# Patient Record
Sex: Female | Born: 1975 | Race: Black or African American | Hispanic: No | Marital: Married | Smoking: Never smoker
Health system: Southern US, Community
[De-identification: ages and names within clinical notes are randomized; demographics above are authoritative.]

## PROBLEM LIST (undated history)

## (undated) DIAGNOSIS — D649 Anemia, unspecified: Secondary | ICD-10-CM

## (undated) DIAGNOSIS — D259 Leiomyoma of uterus, unspecified: Secondary | ICD-10-CM

## (undated) HISTORY — PX: TUBAL LIGATION: SHX77

---

## 1998-03-01 ENCOUNTER — Ambulatory Visit (HOSPITAL_COMMUNITY): Admission: RE | Admit: 1998-03-01 | Discharge: 1998-03-01 | Payer: Self-pay | Admitting: Internal Medicine

## 1998-03-07 ENCOUNTER — Other Ambulatory Visit: Admission: RE | Admit: 1998-03-07 | Discharge: 1998-03-07 | Payer: Self-pay | Admitting: Internal Medicine

## 2011-10-19 DIAGNOSIS — E049 Nontoxic goiter, unspecified: Secondary | ICD-10-CM | POA: Insufficient documentation

## 2011-10-19 DIAGNOSIS — D649 Anemia, unspecified: Secondary | ICD-10-CM | POA: Insufficient documentation

## 2011-10-19 DIAGNOSIS — E559 Vitamin D deficiency, unspecified: Secondary | ICD-10-CM | POA: Insufficient documentation

## 2011-10-19 DIAGNOSIS — R87619 Unspecified abnormal cytological findings in specimens from cervix uteri: Secondary | ICD-10-CM | POA: Insufficient documentation

## 2011-10-19 DIAGNOSIS — A6000 Herpesviral infection of urogenital system, unspecified: Secondary | ICD-10-CM | POA: Insufficient documentation

## 2014-04-27 DIAGNOSIS — Z800 Family history of malignant neoplasm of digestive organs: Secondary | ICD-10-CM | POA: Insufficient documentation

## 2014-12-12 DIAGNOSIS — L658 Other specified nonscarring hair loss: Secondary | ICD-10-CM | POA: Insufficient documentation

## 2016-09-22 DIAGNOSIS — E669 Obesity, unspecified: Secondary | ICD-10-CM | POA: Insufficient documentation

## 2018-05-17 ENCOUNTER — Other Ambulatory Visit (HOSPITAL_BASED_OUTPATIENT_CLINIC_OR_DEPARTMENT_OTHER): Payer: Self-pay | Admitting: Nurse Practitioner

## 2018-05-17 ENCOUNTER — Ambulatory Visit (HOSPITAL_BASED_OUTPATIENT_CLINIC_OR_DEPARTMENT_OTHER)
Admission: RE | Admit: 2018-05-17 | Discharge: 2018-05-17 | Disposition: A | Discharge status: Home / Self Care | Payer: Managed Care, Other (non HMO) | Source: Ambulatory Visit | Attending: Nurse Practitioner | Admitting: Nurse Practitioner

## 2018-05-17 DIAGNOSIS — E049 Nontoxic goiter, unspecified: Secondary | ICD-10-CM | POA: Diagnosis present

## 2018-05-17 DIAGNOSIS — M542 Cervicalgia: Secondary | ICD-10-CM | POA: Diagnosis not present

## 2018-05-17 DIAGNOSIS — E042 Nontoxic multinodular goiter: Secondary | ICD-10-CM | POA: Insufficient documentation

## 2018-10-25 ENCOUNTER — Other Ambulatory Visit: Payer: Self-pay | Admitting: Obstetrics and Gynecology

## 2018-10-25 DIAGNOSIS — D250 Submucous leiomyoma of uterus: Secondary | ICD-10-CM

## 2018-11-17 ENCOUNTER — Ambulatory Visit
Admission: RE | Admit: 2018-11-17 | Discharge: 2018-11-17 | Disposition: A | Discharge status: Home / Self Care | Payer: Managed Care, Other (non HMO) | Source: Ambulatory Visit | Attending: Obstetrics and Gynecology | Admitting: Obstetrics and Gynecology

## 2018-11-17 ENCOUNTER — Other Ambulatory Visit: Payer: Self-pay | Admitting: *Deleted

## 2018-11-17 DIAGNOSIS — D250 Submucous leiomyoma of uterus: Secondary | ICD-10-CM

## 2018-11-17 HISTORY — DX: Anemia, unspecified: D64.9

## 2018-11-17 HISTORY — DX: Leiomyoma of uterus, unspecified: D25.9

## 2018-11-17 HISTORY — PX: IR RADIOLOGIST EVAL & MGMT: IMG5224

## 2018-11-17 NOTE — Consult Note (Signed)
Chief Complaint: Patient was seen in consultation today for menorrhagia at the request of [place]  Referring Physician(s): Rivard,Sandra  History of Present Illness: Bailey Bennett is a 42 y.o. female with a long history of menorrhagia and anemia.  Her cycles are regular occurring once every 28 days.  Day 2 and 3 are very heavy with passage of large clots.  She uses super absorbant pads which she has to change hourly.  This is challenging and affects her ability to perform her activities of daily living.   She recently underwent an ultrasound and was diagnosed with a large uterine fibroid.  She currently takes iron supplementation for her anemia.  She denies significant dysmenorrhea.  She endorses bulk related symptoms, particularly dysuria dn urgency early in the morning.   She has not undergone any other hormonal or surgical therapies for her symptoms.  She denies chest pain, shortness of breath, fever, chills, vaginal discharge or other systemic symptoms.   Past Medical History:  Diagnosis Date  . Anemia    secondary to uterine fibroid  . Uterine fibroid     Past Surgical History:  Procedure Laterality Date  . IR RADIOLOGIST EVAL & MGMT  11/17/2018  . TUBAL LIGATION      Allergies: Patient has no known allergies.  Medications: Prior to Admission medications   Medication Sig Start Date End Date Taking? Authorizing Provider  Cholecalciferol (VITAMIN D) 50 MCG (2000 UT) tablet Take by mouth.   Yes [provider]  ferrous sulfate 324 (65 Fe) MG TBEC Take by mouth.   Yes [provider]  Multiple Vitamin (MULTI-VITAMINS) TABS Take by mouth.   Yes [provider]  valACYclovir (VALTREX) 500 MG tablet Take 2 tablets twice a day for 3 days when flares occur 09/22/16  Yes [provider]     No family history on file.  Social History   Socioeconomic History  . Marital status: Married    Spouse name: Not on file  . Number of  children: Not on file  . Years of education: Not on file  . Highest education level: Not on file  Occupational History  . Not on file  Social Needs  . Financial resource strain: Not on file  . Food insecurity:    Worry: Not on file    Inability: Not on file  . Transportation needs:    Medical: Not on file    Non-medical: Not on file  Tobacco Use  . Smoking status: Not on file  Substance and Sexual Activity  . Alcohol use: Not on file  . Drug use: Not on file  . Sexual activity: Not on file  Lifestyle  . Physical activity:    Days per week: Not on file    Minutes per session: Not on file  . Stress: Not on file  Relationships  . Social connections:    Talks on phone: Not on file    Gets together: Not on file    Attends religious service: Not on file    Active member of club or organization: Not on file    Attends meetings of clubs or organizations: Not on file    Relationship status: Not on file  Other Topics Concern  . Not on file  Social History Narrative  . Not on file    Review of Systems: A 12 point ROS discussed and pertinent positives are indicated in the HPI above.  All other systems are negative.  Review of Systems  Vital Signs: BP (!) 127/93 (BP Location: Right Arm, Patient Position: Sitting, Cuff Size: Normal)   Pulse 84   Temp 98.5 F (36.9 C)   Resp 14   LMP 10/28/2018 (Exact Date)   SpO2 100%   Physical Exam  Constitutional: She is oriented to person, place, and time. She appears well-developed and well-nourished. No distress.  HENT:  Head: Normocephalic and atraumatic.  Eyes: No scleral icterus.  Neck: Thyromegaly present.  Cardiovascular: Normal rate and regular rhythm.  Pulmonary/Chest: Effort normal and breath sounds normal.  Abdominal: Soft. She exhibits no distension. There is no tenderness.  Neurological: She is alert and oriented to person, place, and time.  Skin: Skin is warm and dry.  Psychiatric: She has a normal mood and affect.  Her behavior is normal.  Nursing note and vitals reviewed.    Imaging: Ir Radiologist Eval & Mgmt  Result Date: 11/17/2018 Please refer to notes tab for details about interventional procedure. (Op Note)   Labs:  CBC: No results for input(s): WBC, HGB, HCT, PLT in the last 8760 hours.  COAGS: No results for input(s): INR, APTT in the last 8760 hours.  BMP: No results for input(s): NA, K, CL, CO2, GLUCOSE, BUN, CALCIUM, CREATININE, GFRNONAA, GFRAA in the last 8760 hours.  Invalid input(s): CMP  LIVER FUNCTION TESTS: No results for input(s): BILITOT, AST, ALT, ALKPHOS, PROT, ALBUMIN in the last 8760 hours.  TUMOR MARKERS: No results for input(s): AFPTM, CEA, CA199, CHROMGRNA in the last 8760 hours.  Assessment and Plan:  42 year-old female with symptomatic uterine fibroids.  Her primary symptoms are menorrhagia with iron deficiency anemia and bulk relates symptoms including dysuria.    We discussed the natural history of uterine fibroids and the various treatment options including hysterectomy and uterine artery embolization.  We discussed the risks, benefits and alternatives to [place].  AT this time, she wants to avoid surgery and is interested in pursuing [place].   1.) Pelvic MRI with gadolinium contrast.  2.) Barring any unexpected findings on the MRI, proceed with superior hypogastric nerve block and [place].   Thank you for this interesting consult.  I greatly enjoyed meeting Bailey Bennett and look forward to participating in their care.  A copy of this report was sent to the requesting provider on this date.  Electronically Signed: Jacqulynn Cadet 11/17/2018, 4:25 PM   I spent a total of  40 Minutes  in face to face in clinical consultation, greater than 50% of which was counseling/coordinating care for symptomatic uterine fibroids.

## 2019-01-02 ENCOUNTER — Other Ambulatory Visit (HOSPITAL_COMMUNITY): Payer: Self-pay | Admitting: Interventional Radiology

## 2019-01-02 DIAGNOSIS — D259 Leiomyoma of uterus, unspecified: Secondary | ICD-10-CM

## 2019-02-27 DIAGNOSIS — Z01818 Encounter for other preprocedural examination: Secondary | ICD-10-CM | POA: Diagnosis not present

## 2019-02-27 DIAGNOSIS — D252 Subserosal leiomyoma of uterus: Secondary | ICD-10-CM | POA: Diagnosis not present

## 2019-02-27 DIAGNOSIS — Z3202 Encounter for pregnancy test, result negative: Secondary | ICD-10-CM | POA: Diagnosis not present

## 2019-02-27 DIAGNOSIS — N920 Excessive and frequent menstruation with regular cycle: Secondary | ICD-10-CM | POA: Diagnosis not present

## 2019-05-04 DIAGNOSIS — M545 Low back pain: Secondary | ICD-10-CM | POA: Diagnosis not present

## 2019-05-04 DIAGNOSIS — M5417 Radiculopathy, lumbosacral region: Secondary | ICD-10-CM | POA: Diagnosis not present

## 2019-05-04 DIAGNOSIS — M9903 Segmental and somatic dysfunction of lumbar region: Secondary | ICD-10-CM | POA: Diagnosis not present

## 2019-05-04 DIAGNOSIS — M9902 Segmental and somatic dysfunction of thoracic region: Secondary | ICD-10-CM | POA: Diagnosis not present

## 2019-05-10 DIAGNOSIS — M5417 Radiculopathy, lumbosacral region: Secondary | ICD-10-CM | POA: Diagnosis not present

## 2019-05-10 DIAGNOSIS — M545 Low back pain: Secondary | ICD-10-CM | POA: Diagnosis not present

## 2019-05-10 DIAGNOSIS — M9902 Segmental and somatic dysfunction of thoracic region: Secondary | ICD-10-CM | POA: Diagnosis not present

## 2019-05-10 DIAGNOSIS — M9903 Segmental and somatic dysfunction of lumbar region: Secondary | ICD-10-CM | POA: Diagnosis not present

## 2019-05-17 DIAGNOSIS — M545 Low back pain: Secondary | ICD-10-CM | POA: Diagnosis not present

## 2019-05-17 DIAGNOSIS — M5417 Radiculopathy, lumbosacral region: Secondary | ICD-10-CM | POA: Diagnosis not present

## 2019-05-17 DIAGNOSIS — M9902 Segmental and somatic dysfunction of thoracic region: Secondary | ICD-10-CM | POA: Diagnosis not present

## 2019-05-17 DIAGNOSIS — M9903 Segmental and somatic dysfunction of lumbar region: Secondary | ICD-10-CM | POA: Diagnosis not present

## 2019-05-19 DIAGNOSIS — M5417 Radiculopathy, lumbosacral region: Secondary | ICD-10-CM | POA: Diagnosis not present

## 2019-05-19 DIAGNOSIS — M9902 Segmental and somatic dysfunction of thoracic region: Secondary | ICD-10-CM | POA: Diagnosis not present

## 2019-05-19 DIAGNOSIS — M545 Low back pain: Secondary | ICD-10-CM | POA: Diagnosis not present

## 2019-05-19 DIAGNOSIS — M9903 Segmental and somatic dysfunction of lumbar region: Secondary | ICD-10-CM | POA: Diagnosis not present

## 2019-05-25 DIAGNOSIS — M9903 Segmental and somatic dysfunction of lumbar region: Secondary | ICD-10-CM | POA: Diagnosis not present

## 2019-05-25 DIAGNOSIS — M5417 Radiculopathy, lumbosacral region: Secondary | ICD-10-CM | POA: Diagnosis not present

## 2019-05-25 DIAGNOSIS — M545 Low back pain: Secondary | ICD-10-CM | POA: Diagnosis not present

## 2019-05-25 DIAGNOSIS — M9902 Segmental and somatic dysfunction of thoracic region: Secondary | ICD-10-CM | POA: Diagnosis not present

## 2019-08-24 DIAGNOSIS — D259 Leiomyoma of uterus, unspecified: Secondary | ICD-10-CM | POA: Diagnosis not present

## 2019-09-12 DIAGNOSIS — D649 Anemia, unspecified: Secondary | ICD-10-CM | POA: Diagnosis not present

## 2019-09-12 DIAGNOSIS — Z6836 Body mass index (BMI) 36.0-36.9, adult: Secondary | ICD-10-CM | POA: Diagnosis not present

## 2019-09-12 DIAGNOSIS — D259 Leiomyoma of uterus, unspecified: Secondary | ICD-10-CM | POA: Diagnosis not present

## 2019-09-12 DIAGNOSIS — N920 Excessive and frequent menstruation with regular cycle: Secondary | ICD-10-CM | POA: Diagnosis not present

## 2019-09-26 DIAGNOSIS — N920 Excessive and frequent menstruation with regular cycle: Secondary | ICD-10-CM | POA: Diagnosis not present

## 2019-09-26 DIAGNOSIS — D259 Leiomyoma of uterus, unspecified: Secondary | ICD-10-CM | POA: Diagnosis not present

## 2019-11-14 ENCOUNTER — Other Ambulatory Visit: Payer: Self-pay | Admitting: Obstetrics

## 2019-11-29 ENCOUNTER — Other Ambulatory Visit: Payer: Self-pay | Admitting: Obstetrics

## 2019-11-29 ENCOUNTER — Other Ambulatory Visit (HOSPITAL_COMMUNITY)
Admission: RE | Admit: 2019-11-29 | Discharge: 2019-11-29 | Disposition: A | Discharge status: Home / Self Care | Payer: BC Managed Care – PPO | Source: Ambulatory Visit | Attending: Obstetrics | Admitting: Obstetrics

## 2019-11-29 ENCOUNTER — Other Ambulatory Visit (HOSPITAL_COMMUNITY): Payer: Managed Care, Other (non HMO)

## 2019-11-29 DIAGNOSIS — Z01812 Encounter for preprocedural laboratory examination: Secondary | ICD-10-CM | POA: Diagnosis not present

## 2019-11-29 DIAGNOSIS — Z20828 Contact with and (suspected) exposure to other viral communicable diseases: Secondary | ICD-10-CM | POA: Diagnosis not present

## 2019-11-29 LAB — SARS CORONAVIRUS 2 (TAT 6-24 HRS): SARS Coronavirus 2: NEGATIVE

## 2019-11-29 NOTE — Progress Notes (Signed)
[facility] [id number] - [place], [place] - [address] AT [address] [place]  [address] Phone: [phone] Fax: [phone]    Your procedure is scheduled on Friday, December 18th.  Report to [facility] Main Entrance "A" at 6:00 A.M., and check in at the Admitting office.  Call this number if you have problems the morning of surgery:  [phone]  Call [phone] if you have any questions prior to your surgery date Monday-Friday 8am-4pm   Remember:  Do not eat or drink after midnight the night before your surgery  You may drink clear liquids until 5:00 A.M. the morning of your surgery.   Clear liquids allowed are: Water, Non-Citrus Juices (without pulp), Carbonated Beverages, Clear Tea, Black Coffee Only, and Gatorade    Take these medicines the morning of surgery with A SIP OF WATER: NONE  As of today, STOP taking any Aspirin (unless otherwise instructed by your surgeon), Aleve, Naproxen, Ibuprofen, Motrin, Advil, Goody's, BC's, all herbal medications, fish oil, and all vitamins.  The Morning of Surgery  Do not wear jewelry, make-up or nail polish.  Do not wear lotions, powders, perfumes or deodorant  Do not shave 48 hours prior to surgery.    Do not bring valuables to the hospital.  [facility] is not responsible for any belongings or valuables.  If you are a smoker, DO NOT Smoke 24 hours prior to surgery  If you wear a CPAP at night please bring your mask, tubing, and machine the morning of surgery   Remember that you must have someone to transport you home after your surgery, and remain with you for 24 hours if you are discharged the same day.  Please bring cases for contacts, glasses, hearing aids, dentures or bridgework because it cannot be worn into surgery.   Leave your suitcase in the car.  After surgery it may be brought to your room.  For patients admitted to the hospital, discharge time will be determined by  your treatment team.  Patients discharged the day of surgery will not be allowed to drive home.   Special instructions:   - Preparing For Surgery  Before surgery, you can play an important role. Because skin is not sterile, your skin needs to be as free of germs as possible. You can reduce the number of germs on your skin by washing with CHG (chlorahexidine gluconate) Soap before surgery.  CHG is an antiseptic cleaner which kills germs and bonds with the skin to continue killing germs even after washing.    Oral Hygiene is also important to reduce your risk of infection.  Remember - BRUSH YOUR TEETH THE MORNING OF SURGERY WITH YOUR REGULAR TOOTHPASTE  Please do not use if you have an allergy to CHG or antibacterial soaps. If your skin becomes reddened/irritated stop using the CHG.  Do not shave (including legs and underarms) for at least 48 hours prior to first CHG shower. It is OK to shave your face.  Please follow these instructions carefully.   1. Shower the NIGHT BEFORE SURGERY and the MORNING OF SURGERY with CHG Soap.   2. If you chose to wash your hair, wash your hair first as usual with your normal shampoo.  3. After you shampoo, rinse your hair and body thoroughly to remove the shampoo.  4. Use CHG as you would any other liquid soap. You can apply CHG directly to the skin and wash gently  with a scrungie or a clean washcloth.   5. Apply the CHG Soap to your body ONLY FROM THE NECK DOWN.  Do not use on open wounds or open sores. Avoid contact with your eyes, ears, mouth and genitals (private parts). Wash Face and genitals (private parts)  with your normal soap.   6. Wash thoroughly, paying special attention to the area where your surgery will be performed.  7. Thoroughly rinse your body with warm water from the neck down.  8. DO NOT shower/wash with your normal soap after using and rinsing off the CHG Soap.  9. Pat yourself dry with a CLEAN TOWEL.  10. Wear CLEAN  PAJAMAS to bed the night before surgery, wear comfortable clothes the morning of surgery  11. Place CLEAN SHEETS on your bed the night of your first shower and DO NOT SLEEP WITH PETS.  Day of Surgery: Please shower the morning of surgery with the CHG soap Do not apply any deodorants/lotions. Please wear clean clothes to the hospital/surgery center.   Remember to brush your teeth WITH YOUR REGULAR TOOTHPASTE.  Please read over the following fact sheets that you were given.

## 2019-11-30 ENCOUNTER — Encounter (HOSPITAL_COMMUNITY): Payer: Self-pay

## 2019-11-30 ENCOUNTER — Other Ambulatory Visit: Payer: Self-pay

## 2019-11-30 ENCOUNTER — Encounter (HOSPITAL_COMMUNITY)
Admission: RE | Admit: 2019-11-30 | Discharge: 2019-11-30 | Disposition: A | Discharge status: Home / Self Care | Payer: BC Managed Care – PPO | Source: Ambulatory Visit | Attending: Obstetrics | Admitting: Obstetrics

## 2019-11-30 DIAGNOSIS — N888 Other specified noninflammatory disorders of cervix uteri: Secondary | ICD-10-CM | POA: Diagnosis not present

## 2019-11-30 DIAGNOSIS — E559 Vitamin D deficiency, unspecified: Secondary | ICD-10-CM | POA: Diagnosis not present

## 2019-11-30 DIAGNOSIS — D251 Intramural leiomyoma of uterus: Secondary | ICD-10-CM | POA: Diagnosis not present

## 2019-11-30 DIAGNOSIS — D259 Leiomyoma of uterus, unspecified: Secondary | ICD-10-CM | POA: Diagnosis not present

## 2019-11-30 DIAGNOSIS — N920 Excessive and frequent menstruation with regular cycle: Secondary | ICD-10-CM | POA: Diagnosis not present

## 2019-11-30 DIAGNOSIS — R030 Elevated blood-pressure reading, without diagnosis of hypertension: Secondary | ICD-10-CM | POA: Diagnosis not present

## 2019-11-30 DIAGNOSIS — N838 Other noninflammatory disorders of ovary, fallopian tube and broad ligament: Secondary | ICD-10-CM | POA: Diagnosis not present

## 2019-11-30 DIAGNOSIS — D649 Anemia, unspecified: Secondary | ICD-10-CM | POA: Diagnosis not present

## 2019-11-30 DIAGNOSIS — Z20828 Contact with and (suspected) exposure to other viral communicable diseases: Secondary | ICD-10-CM | POA: Diagnosis not present

## 2019-11-30 LAB — CBC
HCT: 39.8 % (ref 36.0–46.0)
Hemoglobin: 12 g/dL (ref 12.0–15.0)
MCH: 25.5 pg — ABNORMAL LOW (ref 26.0–34.0)
MCHC: 30.2 g/dL (ref 30.0–36.0)
MCV: 84.7 fL (ref 80.0–100.0)
Platelets: 355 10*3/uL (ref 150–400)
RBC: 4.7 MIL/uL (ref 3.87–5.11)
RDW: 16.6 % — ABNORMAL HIGH (ref 11.5–15.5)
WBC: 4.7 10*3/uL (ref 4.0–10.5)
nRBC: 0 % (ref 0.0–0.2)

## 2019-11-30 LAB — TYPE AND SCREEN
ABO/RH(D): B POS
Antibody Screen: NEGATIVE

## 2019-11-30 LAB — ABO/RH: ABO/RH(D): B POS

## 2019-11-30 MED ORDER — DEXTROSE 5 % IV SOLN
3.0000 g | INTRAVENOUS | Status: DC
  Filled 2019-11-30: qty 3000

## 2019-11-30 NOTE — Anesthesia Preprocedure Evaluation (Addendum)
Anesthesia Evaluation  Patient identified by MRN, date of birth, ID band Patient awake    Reviewed: Allergy & Precautions, NPO status , Patient's Chart, lab work & pertinent test results  History of Anesthesia Complications Negative for: history of anesthetic complications  Airway Mallampati: II  TM Distance: >3 FB Neck ROM: Full    Dental  (+) Dental Advisory Given, Teeth Intact   Pulmonary neg pulmonary ROS,    Pulmonary exam normal        Cardiovascular negative cardio ROS Normal cardiovascular exam     Neuro/Psych negative neurological ROS  negative psych ROS   GI/Hepatic negative GI ROS, Neg liver ROS,   Endo/Other   Obesity   Renal/GU negative Renal ROS     Musculoskeletal negative musculoskeletal ROS (+)   Abdominal   Peds  Hematology  (+) anemia ,   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  Reproductive/Obstetrics  Fibroids S/p tubal ligation                             Anesthesia Physical Anesthesia Plan  ASA: II  Anesthesia Plan: General   Post-op Pain Management:    Induction: Intravenous  PONV Risk Score and Plan: 4 or greater and Treatment may vary due to age or medical condition, Ondansetron, Scopolamine patch - Pre-op, Dexamethasone and Midazolam  Airway Management Planned: Oral ETT  Additional Equipment: None  Intra-op Plan:   Post-operative Plan: Extubation in OR  Informed Consent: I have reviewed the patients History and Physical, chart, labs and discussed the procedure including the risks, benefits and alternatives for the proposed anesthesia with the patient or authorized representative who has indicated his/her understanding and acceptance.     Dental advisory given  Plan Discussed with: CRNA and Anesthesiologist  Anesthesia Plan Comments:        Anesthesia Quick Evaluation

## 2019-11-30 NOTE — Progress Notes (Addendum)
Your procedure is scheduled on Friday, December 18th, from 08:00 AM to 10:24 AM.  Report to [facility] Main Entrance "A" at 6:00 A.M., and check in at the Admitting office.  Call this number if you have problems the morning of surgery:  [phone]   Remember:  Do not eat or drink after midnight the night before your surgery.   Take these medicines the morning of surgery with A SIP OF WATER: NONE  As of today, STOP taking any Aspirin (unless otherwise instructed by your surgeon), Aleve, Naproxen, Ibuprofen, Motrin, Advil, Goody's, BC's, all herbal medications, fish oil, and all vitamins.  The Morning of Surgery  Do not wear jewelry, make-up or nail polish.  Do not wear lotions, powders, perfumes or deodorant  Do not shave 48 hours prior to surgery.    Do not bring valuables to the hospital.  [facility] is not responsible for any belongings or valuables.  If you are a smoker, DO NOT Smoke 24 hours prior to surgery  If you wear a CPAP at night please bring your mask, tubing, and machine the morning of surgery   Remember that you must have someone to transport you home after your surgery, and remain with you for 24 hours if you are discharged the same day.  Please bring cases for contacts, glasses, hearing aids, dentures or bridgework because it cannot be worn into surgery.   Leave your suitcase in the car.  After surgery it may be brought to your room.  For patients admitted to the hospital, discharge time will be determined by your treatment team.  Patients discharged the day of surgery will not be allowed to drive home.   Special instructions:   [place]- Preparing For Surgery  Before surgery, you can play an important role. Because skin is not sterile, your skin needs to be as free of germs as possible. You can reduce the number of germs on your skin by washing with CHG (chlorahexidine gluconate) Soap before surgery.  CHG is an antiseptic cleaner which kills germs and  bonds with the skin to continue killing germs even after washing.    Please do not use if you have an allergy to CHG or antibacterial soaps. If your skin becomes reddened/irritated stop using the CHG.  Do not shave (including legs and underarms) for at least 48 hours prior to first CHG shower. It is OK to shave your face.  Please follow these instructions carefully.   1. Shower the NIGHT BEFORE SURGERY and the MORNING OF SURGERY with CHG Soap.   2. If you chose to wash your hair, wash your hair first as usual with your normal shampoo.  3. After you shampoo, rinse your hair and body thoroughly to remove the shampoo.  4. Use CHG as you would any other liquid soap. You can apply CHG directly to the skin and wash gently with a scrungie or a clean washcloth.   5. Apply the CHG Soap to your body ONLY FROM THE NECK DOWN.  Do not use on open wounds or open sores. Avoid contact with your eyes, ears, mouth and genitals (private parts). Wash Face and genitals (private parts)  with your normal soap.   6. Wash thoroughly, paying special attention to the area where your surgery will be performed.  7. Thoroughly rinse your body with warm water from the neck down.  8. DO NOT shower/wash with your normal soap after using and rinsing off the CHG Soap.  9. [place]  yourself dry with a CLEAN TOWEL.  10. Wear CLEAN PAJAMAS to bed the night before surgery, wear comfortable clothes the morning of surgery  11. Place CLEAN SHEETS on your bed the night of your first shower and DO NOT SLEEP WITH PETS.  Day of Surgery:  Remember to brush your teeth WITH YOUR REGULAR TOOTHPASTE. Please shower the morning of surgery with the CHG soap Do not apply any deodorants/lotions. Please wear clean clothes to the hospital/surgery center.     Please read over the following fact sheets that you were given.

## 2019-11-30 NOTE — Progress Notes (Signed)
PCP - Eldridge Abrahams, PA Cardiologist - Denies  PPM/ICD - Denies  Chest x-ray - N/A EKG - N/A Stress Test - Denies ECHO - Denies Cardiac Cath - Denies  Sleep Study - Denies  Patient denies being a diabetic.  Blood Thinner Instructions: Aspirin Instructions:  PRE-SURGERY Ensure or G2-   COVID TEST- 11/29/2019   Anesthesia review: No  Patient denies shortness of breath, fever, cough and chest pain at PAT appointment  Coronavirus Screening  Have you experienced the following symptoms:  Cough yes/no: No Fever (>100.33F)  yes/no: No Runny nose yes/no: No Sore throat yes/no: No Difficulty breathing/shortness of breath  yes/no: No  Have you or a family member traveled in the last 14 days and where? yes/no: No   If the patient indicates "YES" to the above questions, their PAT will be rescheduled to limit the exposure to others and, the surgeon will be notified. THE PATIENT WILL NEED TO BE ASYMPTOMATIC FOR 14 DAYS.   If the patient is not experiencing any of these symptoms, the PAT nurse will instruct them to NOT bring anyone with them to their appointment since they may have these symptoms or traveled as well.   Please remind your patients and families that hospital visitation restrictions are in effect and the importance of the restrictions.     All instructions explained to the patient, with a verbal understanding of the material. Patient agrees to go over the instructions while at home for a better understanding. Patient also instructed to self quarantine after being tested for COVID-19. The opportunity to ask questions was provided.

## 2019-12-01 ENCOUNTER — Inpatient Hospital Stay (HOSPITAL_COMMUNITY)
Admission: RE | Admit: 2019-12-01 | Discharge: 2019-12-02 | DRG: 743 | Disposition: A | Discharge status: Home / Self Care | Payer: BC Managed Care – PPO | Attending: Obstetrics | Admitting: Obstetrics

## 2019-12-01 ENCOUNTER — Other Ambulatory Visit: Payer: Self-pay

## 2019-12-01 ENCOUNTER — Encounter (HOSPITAL_COMMUNITY): Payer: Self-pay | Admitting: Obstetrics

## 2019-12-01 ENCOUNTER — Inpatient Hospital Stay (HOSPITAL_COMMUNITY): Payer: BC Managed Care – PPO | Admitting: Anesthesiology

## 2019-12-01 ENCOUNTER — Encounter (HOSPITAL_COMMUNITY)
Admission: RE | Disposition: A | Discharge status: Home / Self Care | Payer: Self-pay | Source: Home / Self Care | Attending: Obstetrics

## 2019-12-01 DIAGNOSIS — N920 Excessive and frequent menstruation with regular cycle: Secondary | ICD-10-CM | POA: Diagnosis present

## 2019-12-01 DIAGNOSIS — Z20828 Contact with and (suspected) exposure to other viral communicable diseases: Secondary | ICD-10-CM | POA: Diagnosis present

## 2019-12-01 DIAGNOSIS — D219 Benign neoplasm of connective and other soft tissue, unspecified: Secondary | ICD-10-CM | POA: Diagnosis present

## 2019-12-01 DIAGNOSIS — D649 Anemia, unspecified: Secondary | ICD-10-CM | POA: Diagnosis not present

## 2019-12-01 DIAGNOSIS — D259 Leiomyoma of uterus, unspecified: Principal | ICD-10-CM | POA: Diagnosis present

## 2019-12-01 DIAGNOSIS — R030 Elevated blood-pressure reading, without diagnosis of hypertension: Secondary | ICD-10-CM | POA: Diagnosis present

## 2019-12-01 HISTORY — PX: HYSTERECTOMY ABDOMINAL WITH SALPINGECTOMY: SHX6725

## 2019-12-01 HISTORY — PX: ABDOMINAL HYSTERECTOMY: SHX81

## 2019-12-01 LAB — POCT PREGNANCY, URINE: Preg Test, Ur: NEGATIVE

## 2019-12-01 SURGERY — HYSTERECTOMY, TOTAL, ABDOMINAL, WITH SALPINGECTOMY
Anesthesia: General | Laterality: Bilateral

## 2019-12-01 MED ORDER — DEXAMETHASONE SODIUM PHOSPHATE 10 MG/ML IJ SOLN
INTRAMUSCULAR | Status: DC | PRN
  Administered 2019-12-01: 10 mg via INTRAVENOUS

## 2019-12-01 MED ORDER — ONDANSETRON HCL 4 MG/2ML IJ SOLN
INTRAMUSCULAR | Status: AC
  Filled 2019-12-01: qty 2

## 2019-12-01 MED ORDER — LIDOCAINE 2% (20 MG/ML) 5 ML SYRINGE
INTRAMUSCULAR | Status: AC
  Filled 2019-12-01: qty 5

## 2019-12-01 MED ORDER — MENTHOL 3 MG MT LOZG: 1.0000 | LOZENGE | OROMUCOSAL | Status: DC | PRN

## 2019-12-01 MED ORDER — VASOPRESSIN 20 UNIT/ML IV SOLN
INTRAVENOUS | Status: AC
  Filled 2019-12-01: qty 1

## 2019-12-01 MED ORDER — LACTATED RINGERS IV SOLN: INTRAVENOUS | Status: DC

## 2019-12-01 MED ORDER — MIDAZOLAM HCL 5 MG/5ML IJ SOLN
INTRAMUSCULAR | Status: DC | PRN
  Administered 2019-12-01: 2 mg via INTRAVENOUS

## 2019-12-01 MED ORDER — FENTANYL CITRATE (PF) 100 MCG/2ML IJ SOLN
INTRAMUSCULAR | Status: AC
  Filled 2019-12-01: qty 2

## 2019-12-01 MED ORDER — ONDANSETRON HCL 4 MG/2ML IJ SOLN
4.0000 mg | Freq: Four times a day (QID) | INTRAMUSCULAR | Status: DC | PRN
  Administered 2019-12-01: 4 mg via INTRAVENOUS
  Filled 2019-12-01: qty 2

## 2019-12-01 MED ORDER — OXYCODONE HCL 5 MG PO TABS
5.0000 mg | ORAL_TABLET | ORAL | Status: DC | PRN
  Administered 2019-12-01 – 2019-12-02 (×4): 10 mg via ORAL
  Filled 2019-12-01 (×4): qty 2

## 2019-12-01 MED ORDER — KETOROLAC TROMETHAMINE 30 MG/ML IJ SOLN
30.0000 mg | Freq: Four times a day (QID) | INTRAMUSCULAR | Status: DC
  Administered 2019-12-01 – 2019-12-02 (×5): 30 mg via INTRAVENOUS
  Filled 2019-12-01 (×5): qty 1

## 2019-12-01 MED ORDER — SCOPOLAMINE 1 MG/3DAYS TD PT72
MEDICATED_PATCH | TRANSDERMAL | Status: AC
  Filled 2019-12-01: qty 1

## 2019-12-01 MED ORDER — ONDANSETRON HCL 4 MG PO TABS: 4.0000 mg | ORAL_TABLET | Freq: Four times a day (QID) | ORAL | Status: DC | PRN

## 2019-12-01 MED ORDER — FENTANYL CITRATE (PF) 100 MCG/2ML IJ SOLN
INTRAMUSCULAR | Status: DC | PRN
  Administered 2019-12-01: 50 ug via INTRAVENOUS
  Administered 2019-12-01: 100 ug via INTRAVENOUS
  Administered 2019-12-01 (×2): 50 ug via INTRAVENOUS

## 2019-12-01 MED ORDER — MIDAZOLAM HCL 2 MG/2ML IJ SOLN
INTRAMUSCULAR | Status: AC
  Filled 2019-12-01: qty 2

## 2019-12-01 MED ORDER — SIMETHICONE 80 MG PO CHEW: 80.0000 mg | CHEWABLE_TABLET | Freq: Four times a day (QID) | ORAL | Status: DC | PRN

## 2019-12-01 MED ORDER — HYDROMORPHONE HCL 1 MG/ML IJ SOLN: 1.0000 mg | INTRAMUSCULAR | Status: DC | PRN

## 2019-12-01 MED ORDER — FENTANYL CITRATE (PF) 250 MCG/5ML IJ SOLN
INTRAMUSCULAR | Status: AC
  Filled 2019-12-01: qty 5

## 2019-12-01 MED ORDER — SCOPOLAMINE 1 MG/3DAYS TD PT72
MEDICATED_PATCH | TRANSDERMAL | Status: DC | PRN
  Administered 2019-12-01: 1 via TRANSDERMAL

## 2019-12-01 MED ORDER — PROMETHAZINE HCL 25 MG/ML IJ SOLN: 6.2500 mg | INTRAMUSCULAR | Status: DC | PRN

## 2019-12-01 MED ORDER — ROCURONIUM BROMIDE 10 MG/ML (PF) SYRINGE
PREFILLED_SYRINGE | INTRAVENOUS | Status: AC
  Filled 2019-12-01: qty 10

## 2019-12-01 MED ORDER — PHENYLEPHRINE HCL-NACL 10-0.9 MG/250ML-% IV SOLN
INTRAVENOUS | Status: DC | PRN
  Administered 2019-12-01: 20 ug/min via INTRAVENOUS

## 2019-12-01 MED ORDER — KETOROLAC TROMETHAMINE 30 MG/ML IJ SOLN: 30.0000 mg | Freq: Four times a day (QID) | INTRAMUSCULAR | Status: DC

## 2019-12-01 MED ORDER — PANTOPRAZOLE SODIUM 40 MG PO TBEC
40.0000 mg | DELAYED_RELEASE_TABLET | Freq: Every day | ORAL | Status: DC
  Administered 2019-12-01 – 2019-12-02 (×2): 40 mg via ORAL
  Filled 2019-12-01 (×2): qty 1

## 2019-12-01 MED ORDER — PROPOFOL 10 MG/ML IV BOLUS
INTRAVENOUS | Status: DC | PRN
  Administered 2019-12-01: 200 mg via INTRAVENOUS

## 2019-12-01 MED ORDER — PROPOFOL 10 MG/ML IV BOLUS
INTRAVENOUS | Status: AC
  Filled 2019-12-01: qty 20

## 2019-12-01 MED ORDER — OXYCODONE HCL 5 MG PO TABS: 5.0000 mg | ORAL_TABLET | Freq: Once | ORAL | Status: DC | PRN

## 2019-12-01 MED ORDER — BUPIVACAINE HCL (PF) 0.25 % IJ SOLN
INTRAMUSCULAR | Status: DC | PRN
  Administered 2019-12-01: 10 mL

## 2019-12-01 MED ORDER — VASOPRESSIN 20 UNIT/ML IV SOLN
INTRAVENOUS | Status: DC | PRN
  Administered 2019-12-01: 20 [IU]

## 2019-12-01 MED ORDER — FENTANYL CITRATE (PF) 100 MCG/2ML IJ SOLN
25.0000 ug | INTRAMUSCULAR | Status: DC | PRN
  Administered 2019-12-01 (×3): 50 ug via INTRAVENOUS

## 2019-12-01 MED ORDER — ROCURONIUM BROMIDE 10 MG/ML (PF) SYRINGE
PREFILLED_SYRINGE | INTRAVENOUS | Status: DC | PRN
  Administered 2019-12-01: 20 mg via INTRAVENOUS
  Administered 2019-12-01: 10 mg via INTRAVENOUS
  Administered 2019-12-01: 80 mg via INTRAVENOUS

## 2019-12-01 MED ORDER — OXYCODONE HCL 5 MG/5ML PO SOLN: 5.0000 mg | Freq: Once | ORAL | Status: DC | PRN

## 2019-12-01 MED ORDER — ONDANSETRON HCL 4 MG/2ML IJ SOLN
INTRAMUSCULAR | Status: DC | PRN
  Administered 2019-12-01: 4 mg via INTRAVENOUS

## 2019-12-01 MED ORDER — BUPIVACAINE HCL (PF) 0.25 % IJ SOLN
INTRAMUSCULAR | Status: AC
  Filled 2019-12-01: qty 30

## 2019-12-01 MED ORDER — DEXAMETHASONE SODIUM PHOSPHATE 10 MG/ML IJ SOLN
INTRAMUSCULAR | Status: AC
  Filled 2019-12-01: qty 1

## 2019-12-01 MED ORDER — SODIUM CHLORIDE 0.9 % IV SOLN: INTRAVENOUS | Status: DC

## 2019-12-01 MED ORDER — ACETAMINOPHEN 500 MG PO TABS
1000.0000 mg | ORAL_TABLET | Freq: Four times a day (QID) | ORAL | Status: DC
  Administered 2019-12-01 – 2019-12-02 (×4): 1000 mg via ORAL
  Filled 2019-12-01 (×5): qty 2

## 2019-12-01 MED ORDER — LIDOCAINE 2% (20 MG/ML) 5 ML SYRINGE
INTRAMUSCULAR | Status: DC | PRN
  Administered 2019-12-01: 60 mg via INTRAVENOUS

## 2019-12-01 MED ORDER — SUGAMMADEX SODIUM 200 MG/2ML IV SOLN
INTRAVENOUS | Status: DC | PRN
  Administered 2019-12-01: 200 mg via INTRAVENOUS

## 2019-12-01 SURGICAL SUPPLY — 49 items
CANISTER SUCT 3000ML PPV (MISCELLANEOUS) ×3 IMPLANT
COVER WAND RF STERILE (DRAPES) ×3 IMPLANT
DECANTER SPIKE VIAL GLASS SM (MISCELLANEOUS) IMPLANT
DERMABOND ADVANCED (GAUZE/BANDAGES/DRESSINGS) ×2
DERMABOND ADVANCED .7 DNX12 (GAUZE/BANDAGES/DRESSINGS) IMPLANT
DRAPE WARM FLUID 44X44 (DRAPES) ×2 IMPLANT
DRSG OPSITE POSTOP 4X10 (GAUZE/BANDAGES/DRESSINGS) ×3 IMPLANT
DRSG PAD ABDOMINAL 8X10 ST (GAUZE/BANDAGES/DRESSINGS) IMPLANT
DURAPREP 26ML APPLICATOR (WOUND CARE) ×3 IMPLANT
ELECT BLADE 6.5 EXT (BLADE) ×2 IMPLANT
ELECT NDL TIP 2.8 STRL (NEEDLE) IMPLANT
ELECT NEEDLE TIP 2.8 STRL (NEEDLE) IMPLANT
GAUZE 4X4 16PLY RFD (DISPOSABLE) ×2 IMPLANT
GAUZE SPONGE 4X4 12PLY STRL (GAUZE/BANDAGES/DRESSINGS) ×2 IMPLANT
GLOVE BIO SURGEON STRL SZ 6.5 (GLOVE) ×2 IMPLANT
GLOVE BIO SURGEONS STRL SZ 6.5 (GLOVE) ×1
GLOVE BIOGEL PI IND STRL 7.0 (GLOVE) ×3 IMPLANT
GLOVE BIOGEL PI INDICATOR 7.0 (GLOVE) ×8
GOWN STRL REUS W/ TWL LRG LVL3 (GOWN DISPOSABLE) ×3 IMPLANT
GOWN STRL REUS W/TWL LRG LVL3 (GOWN DISPOSABLE) ×6
HIBICLENS CHG 4% 4OZ BTL (MISCELLANEOUS) ×1 IMPLANT
KIT TURNOVER KIT B (KITS) ×3 IMPLANT
NEEDLE HYPO 22GX1.5 SAFETY (NEEDLE) ×5 IMPLANT
NS IRRIG 1000ML POUR BTL (IV SOLUTION) ×3 IMPLANT
PACK ABDOMINAL GYN (CUSTOM PROCEDURE TRAY) ×3 IMPLANT
PAD ARMBOARD 7.5X6 YLW CONV (MISCELLANEOUS) ×3 IMPLANT
PAD OB MATERNITY 4.3X12.25 (PERSONAL CARE ITEMS) ×3 IMPLANT
RETRACTOR WND ALEXIS 25 LRG (MISCELLANEOUS) IMPLANT
RTRCTR WOUND ALEXIS 25CM LRG (MISCELLANEOUS) ×3
SPECIMEN JAR MEDIUM (MISCELLANEOUS) ×3 IMPLANT
SPONGE LAP 18X18 RF (DISPOSABLE) ×4 IMPLANT
STAPLER VISISTAT 35W (STAPLE) ×3 IMPLANT
SUT MON AB 4-0 PS1 27 (SUTURE) ×3 IMPLANT
SUT PDS AB 0 CTX 60 (SUTURE) IMPLANT
SUT PLAIN 2 0 XLH (SUTURE) IMPLANT
SUT VIC AB 0 CT1 18XCR BRD8 (SUTURE) ×2 IMPLANT
SUT VIC AB 0 CT1 27 (SUTURE) ×4
SUT VIC AB 0 CT1 27XBRD ANBCTR (SUTURE) ×2 IMPLANT
SUT VIC AB 0 CT1 8-18 (SUTURE) ×6
SUT VIC AB 2-0 CT1 27 (SUTURE) ×2
SUT VIC AB 2-0 CT1 TAPERPNT 27 (SUTURE) ×1 IMPLANT
SUT VIC AB 2-0 SH 27 (SUTURE) ×2
SUT VIC AB 2-0 SH 27XBRD (SUTURE) IMPLANT
SUT VIC AB 3-0 SH 27 (SUTURE)
SUT VIC AB 3-0 SH 27X BRD (SUTURE) IMPLANT
SUT VICRYL 1 TIES 12X18 (SUTURE) ×3 IMPLANT
SYR CONTROL 10ML LL (SYRINGE) ×3 IMPLANT
TOWEL GREEN STERILE FF (TOWEL DISPOSABLE) ×6 IMPLANT
TRAY FOLEY W/BAG SLVR 14FR (SET/KITS/TRAYS/PACK) ×3 IMPLANT

## 2019-12-01 NOTE — Transfer of Care (Signed)
Immediate Anesthesia Transfer of Care Note  Patient: Bailey Bennett  Procedure(s) Performed: HYSTERECTOMY ABDOMINAL WITH SALPINGECTOMY (Bilateral )  Patient Location: PACU  Anesthesia Type:General  Level of Consciousness: drowsy  Airway & Oxygen Therapy: Patient Spontanous Breathing and Patient connected to nasal cannula oxygen  Post-op Assessment: Report given to RN and Post -op Vital signs reviewed and stable  Post vital signs: Reviewed and stable  Last Vitals:  Vitals Value Taken Time  BP 136/98 12/01/19 1053  Temp 37.1 C 12/01/19 1053  Pulse 81 12/01/19 1057  Resp 17 12/01/19 1057  SpO2 100 % 12/01/19 1057  Vitals shown include unvalidated device data.  Last Pain:  Vitals:   12/01/19 1053  TempSrc:   PainSc: Asleep         Complications: No apparent anesthesia complications

## 2019-12-01 NOTE — H&P (Signed)
Chief complaint: Menorrhagia, fibroids  History of present illness: 43 year old G5 P2 with longstanding history of menorrhagia and known fibroids.  Patient desiring definitive surgical management.  Patient has had some relief of her menorrhagia over the past 2 months since starting on Oriahn.   Patient has been unable to have a Pap smear or endometrial biopsy preoperatively due to uterine displacement from large fibroid and inability to visualize or palpate cervix.  Patient has had a recent ultrasound in October 2020 showing uterus 15 x 11 x 11 cm with a volume of 675 cc.  Multiple fibroids between 4 and 10 cm largest being a posterior subserosal 10 cm fibroid.  Past Medical History:  Diagnosis Date  . Anemia    secondary to uterine fibroid  . Uterine fibroid     Past Surgical History:  Procedure Laterality Date  . IR RADIOLOGIST EVAL & MGMT  11/17/2018  . TUBAL LIGATION      Medications: Iron,Oriahn No known drug allergies  Physical exam: Vitals:   12/01/19 0624  BP: (!) 145/102  Pulse: 90  Resp: 18  Temp: 98.6 F (37 C)  TempSrc: Oral  SpO2: 100%  Weight: 116.6 kg  Height: 5\' 11"  (1.803 m)   General: Well-appearing, no distress Abdomen: Soft nontender, fibroid palpable below the umbilicus Skin: Warm and dry GU: Deferred to the OR Lower extremity: Nontender no edema  CBC    Component Value Date/Time   WBC 4.7 11/30/2019 1355   RBC 4.70 11/30/2019 1355   HGB 12.0 11/30/2019 1355   HCT 39.8 11/30/2019 1355   PLT 355 11/30/2019 1355   MCV 84.7 11/30/2019 1355   MCH 25.5 (L) 11/30/2019 1355   MCHC 30.2 11/30/2019 1355   RDW 16.6 (H) 11/30/2019 1355   Assessment plan: 43 year old G5 P2 nonpregnant patient with mass-effect from large fibroids and menorrhagia for definitive surgical management.  Planning total abdominal hysterectomy with bilateral salpingectomy and ovarian retention. -Risk benefits and alternatives to procedure discussed with patient who agrees to  proceed  Ala Dach 12/01/2019 7:44 AM

## 2019-12-01 NOTE — Op Note (Signed)
12/01/2019  10:43 AM  PATIENT:  Bailey Bennett  43 y.o. female  PRE-OPERATIVE DIAGNOSIS:  Uterine Fibroids, Menorrhagia  POST-OPERATIVE DIAGNOSIS:  Uterine Fibroids, Menorrhagia  PROCEDURE:  Procedure(s): HYSTERECTOMY ABDOMINAL WITH SALPINGECTOMY (Bilateral)  SURGEON:  Surgeon(s) and Role:    * Aloha Gell, MD - Primary    * Azucena Fallen, MD - Assisting  PHYSICIAN ASSISTANT: None  ASSISTANTS: As above  ANESTHESIA:   local and general  EBL:  200 mL   BLOOD ADMINISTERED:none  DRAINS: Urinary Catheter (Foley)   LOCAL MEDICATIONS USED:  MARCAINE     SPECIMEN:  Source of Specimen:  Uterus cervix bilateral fallopian tubes  DISPOSITION OF SPECIMEN:  PATHOLOGY  COUNTS:  YES  TOURNIQUET:  * No tourniquets in log *  DICTATION: .Note written in EPIC  PLAN OF CARE: Admit to inpatient   PATIENT DISPOSITION:  PACU - hemodynamically stable.   Delay start of Pharmacological VTE agent (>24hrs) due to surgical blood loss or risk of bleeding: yes    Bailey Bennett PROCEDURE DATE: 12/01/2019  PREOPERATIVE DIAGNOSIS:   Symptomatic fibroids, menorrhagia POSTOPERATIVE DIAGNOSIS:  Symptomatic fibroids, menorrhagia SURGEON:   Aloha Gell, MD OPERATION:  Total abdominal hysterectomy  with bilateral salpingectomy and ovarian retention ANESTHESIA:  General endotracheal.  INDICATIONS: The patient is a 43 y.o. G5, P2 long history of symptomatic uterine fibroids/menorrhagia. The patient made a decision to undergo definite surgical treatment. On the preoperative visit, the risks, benefits, indications, and alternatives of the procedure were reviewed with the patient.  On the day of surgery, the risks of surgery were again discussed with the patient including but not limited to: bleeding which may require transfusion or reoperation; infection which may require antibiotics; injury to bowel, bladder, ureters or other surrounding organs; need for additional procedures; thromboembolic  phenomenon, incisional problems, long-term risk of pelvic organ prolapse and other postoperative/anesthesia complications. Written informed consent was obtained.    OPERATIVE FINDINGS: A 16 week size fibroid uterus with normal tubes and ovaries bilaterally.  ESTIMATED BLOOD LOSS: 200 ml Antibiotics: 2 g Ancef. SPECIMENS:  Uterus, cervix and bilateral tubes,  sent to pathology COMPLICATIONS:  None immediate.   DESCRIPTION OF PROCEDURE: The patient received intravenous antibiotics, 3 g Ancef, and had sequential compression devices applied to her lower extremities while in the preoperative area.   She was taken to the operating room and placed under general anesthesia without difficulty and found to be adequate.The abdomen and perineum were prepped and draped in a sterile manner, and a Foley catheter was inserted into the bladder and attached to constant drainage. After an adequate timeout was performed, a bimanual exam was done to plan the appropriate surgical incision.  A Pfannensteil skin incision was made. This incision was taken down to the fascia using electrocautery with care given to maintain good hemostasis. The fascia was incised in the midline and the fascial incision was then extended bilaterally using sharp dissection. The fascia was then dissected off the underlying rectus muscles using blunt and sharp dissection with cautery when needed. The rectus muscles were split bluntly in the midline and the peritoneum entered sharply without complication. This peritoneal incision was then extended superiorly and inferiorly with care given to prevent bowel or bladder injury.   Attention was then turned to the pelvis.  Enlarged uterus with multiple fibroids were noted.  An Alexis retractor was placed and the bowel was packed away with moist sponges.  The left round ligament was identified elevated with Babcock clamp and suture  ligated then transected.  Anterior and  posterior dissection was done in the  broad ligament.  The right round ligament was identified elevated with Babcock clamp suture-ligated then transected.  Dissection was done in the broad ligament.  This was carried around anteriorly to dissect down the bladder.  The right utero-ovarian ligament was identified a window was created in the broad ligament and curved Haney x2 was placed through this window.  The right utero-ovarian ligament was transected and free tie then suture ligated.  Hemostasis was noted.   At this point due to the large immobile uterus and given that we needed additional traction to see the bilateral adnexa the Alexis retractor was removed.  Using several tenaculums placed into the uterus and the fibroid, after first infusing with vasopressin, the uterus was delivered out of the pelvis.  At this point much better visualization was noted.  The left adnexal structures were noted.  Left fallopian tube was placed on stretch and the mesosalpinx was dissected toward the utero-ovarian ligament.  The left tube was then transected with cautery.  The left utero-ovarian ligament was doubly clamped after prescribing window in the broad ligament.  This was transected then suture ligated.  Dissection was carried down the broad ligament to skeletonize the last left uterine artery which was doubly clamped and transected.  Prior to this additional dissection of the bladder was done.  The right uterine artery was skeletonized with comminution electrocautery and sharp dissection.  This was then doubly clamped transected and ligated..  Straight Heaney clamps were used to come down the cardinal ligament.  These were clamped dissected with the scalpel and suture ligated.  Once we are at the level of the mid cervix the uterus with fibroids were transected with the scalpel.  Having the uterus off the operative field we could better concentrate on the dissection of the cervix.  Several straight bites with the straight Heaney clamps were used to come  down the cardinal ligament.  These were all clamped transected with the scalpel and suture ligated.  Once at the level of the uterosacral ligaments a curved Haney was used to grasp the uterosacrals.  Similar procedure was carried out on each side.  Uterosacral was clamped transected with the curved scissors and suture ligated these pedicles were used later to incorporate to the vaginal cuff.  Curved clamps were then used to transect at the top of the vagina and just under the cervix.  Suture ligation was done of the cuff angle.  The cervix was transected with curved scissors.  A running locked 0 Vicryl suture was used for the midpoint of the vaginal cuff.  Once hemostasis was assured the uterosacral pedicle was tied to the vaginal angle on the ipsilateral side.  This was done laterally.  Then reassess the right fallopian tube as this was not removed earlier.  Placing a Babcock on the tube electrocautery was used to come under the mesosalpinx.  At the medial junction a Emmitte Surgeon clamp was placed under the fallopian tube this was transected and suture-ligated.  Hemostasis was noted.  Ureters were palpated bilaterally low in the pelvis.  Visualization was limited by the patient's habitus but ureteric snap was felt bilaterally.  The ureters were far from the sidewall dissections.  Vaginal cuff was then reevaluated a small amount of bleeding was noted from the left uterine artery and several additional sutures were used to control hemostasis.  All pedicles were evaluated and found to be hemostatic.  Warm normal saline  was used to irrigate the pelvis.  No additional bleeding was noted.  Chabely Norby clamps were placed on the peritoneum after all suture tags were removed and the packing was removed.  About 20 cc of quarter percent Marcaine was dumped into the pelvis.  2-0 Vicryl was used to close the peritoneum.  Underside of the fascia was reinspected found to be hemostatic the fascia was closed with 0 Vicryl in a running  fashion in 2 halves.  The Scarpa's layer was closed with 2-0 plain and the skin was closed with 4-0 Monocryl in a running subcuticular fashion.  Marland Kitchen Sponge, lap, needle, and instrument counts were correct times two. The patient was taken to the recovery area awake, extubated and in stable condition.  Ala Dach 12/01/2019 11:01 AM

## 2019-12-01 NOTE — Anesthesia Procedure Notes (Signed)
Procedure Name: Intubation Date/Time: 12/01/2019 7:59 AM Performed by: Kyung Rudd, CRNA Pre-anesthesia Checklist: Patient identified, Emergency Drugs available, Suction available and Patient being monitored Patient Re-evaluated:Patient Re-evaluated prior to induction Oxygen Delivery Method: Circle system utilized Preoxygenation: Pre-oxygenation with 100% oxygen Induction Type: IV induction Ventilation: Mask ventilation without difficulty Laryngoscope Size: Mac and 3 Grade View: Grade I Tube type: Oral Tube size: 7.0 mm Number of attempts: 1 Airway Equipment and Method: Stylet Placement Confirmation: ETT inserted through vocal cords under direct vision,  positive ETCO2 and breath sounds checked- equal and bilateral Secured at: 21 cm Tube secured with: Tape Dental Injury: Teeth and Oropharynx as per pre-operative assessment

## 2019-12-01 NOTE — Brief Op Note (Signed)
12/01/2019  10:43 AM  PATIENT:  Bailey Bennett  43 y.o. female  PRE-OPERATIVE DIAGNOSIS:  Uterine Fibroids, Menorrhagia  POST-OPERATIVE DIAGNOSIS:  Uterine Fibroids, Menorrhagia  PROCEDURE:  Procedure(s): HYSTERECTOMY ABDOMINAL WITH SALPINGECTOMY (Bilateral)  SURGEON:  Surgeon(s) and Role:    * Aloha Gell, MD - Primary    * Azucena Fallen, MD - Assisting  PHYSICIAN ASSISTANT: None  ASSISTANTS: As above  ANESTHESIA:   local and general  EBL:  200 mL   BLOOD ADMINISTERED:none  DRAINS: Urinary Catheter (Foley)   LOCAL MEDICATIONS USED:  MARCAINE     SPECIMEN:  Source of Specimen:  Uterus cervix bilateral fallopian tubes  DISPOSITION OF SPECIMEN:  PATHOLOGY  COUNTS:  YES  TOURNIQUET:  * No tourniquets in log *  DICTATION: .Note written in EPIC  PLAN OF CARE: Admit to inpatient   PATIENT DISPOSITION:  PACU - hemodynamically stable.   Delay start of Pharmacological VTE agent (>24hrs) due to surgical blood loss or risk of bleeding: yes

## 2019-12-01 NOTE — Anesthesia Postprocedure Evaluation (Signed)
Anesthesia Post Note  Patient: Bailey Bennett  Procedure(s) Performed: HYSTERECTOMY ABDOMINAL WITH SALPINGECTOMY (Bilateral )     Patient location during evaluation: PACU Anesthesia Type: General Level of consciousness: awake and alert Pain management: pain level controlled Vital Signs Assessment: post-procedure vital signs reviewed and stable Respiratory status: spontaneous breathing, nonlabored ventilation and respiratory function stable Cardiovascular status: blood pressure returned to baseline and stable Postop Assessment: no apparent nausea or vomiting Anesthetic complications: no    Last Vitals:  Vitals:   12/01/19 1223 12/01/19 1328  BP: (!) 134/98 (!) 136/93  Pulse: 89 87  Resp: 14 16  Temp:  37 C  SpO2: 99% 98%                   Audry Pili

## 2019-12-02 LAB — CBC
HCT: 34 % — ABNORMAL LOW (ref 36.0–46.0)
Hemoglobin: 10.5 g/dL — ABNORMAL LOW (ref 12.0–15.0)
MCH: 26.1 pg (ref 26.0–34.0)
MCHC: 30.9 g/dL (ref 30.0–36.0)
MCV: 84.6 fL (ref 80.0–100.0)
Platelets: 271 10*3/uL (ref 150–400)
RBC: 4.02 MIL/uL (ref 3.87–5.11)
RDW: 17 % — ABNORMAL HIGH (ref 11.5–15.5)
WBC: 8.6 10*3/uL (ref 4.0–10.5)
nRBC: 0 % (ref 0.0–0.2)

## 2019-12-02 MED ORDER — TRAMADOL HCL 50 MG PO TABS: 50.0000 mg | ORAL_TABLET | Freq: Four times a day (QID) | ORAL | 0 refills | Status: AC | PRN

## 2019-12-02 MED ORDER — OXYCODONE-ACETAMINOPHEN 5-325 MG PO TABS: 1.0000 | ORAL_TABLET | ORAL | 0 refills | Status: AC | PRN

## 2019-12-02 NOTE — Progress Notes (Signed)
Pt is up in the chair at this time. Pt complained of mild dizziness. Vital signs are within normal limits. Will monitor pt.

## 2019-12-02 NOTE — Discharge Summary (Signed)
Bailey Bennett MRN: [id number] DOB/AGE: [birth year] 43 y.o.  Admit date: 12/01/2019 Discharge date: 12/02/19  Admission Diagnoses: Uterine fibroid [D25.9] Fibroids [D21.9]  Discharge Diagnoses: Uterine fibroid [D25.9] Fibroids [D21.9]        Active Problems:   Uterine fibroid   Fibroids   Discharged Condition: stable  Hospital Course: Admitted for planned TAH with b/l salpingectomy. Uncomplicated surgery. Adequate pain control overnight and asking to go home on POD#1. Borderline bps noted, new finding and plan outpt eval. Hgb stable.   Consults: None  Treatments: surgery: TAH   Disposition: Discharge disposition: 01-Home or Self Care       Discharge Instructions    Call MD for:  difficulty breathing, headache or visual disturbances   Complete by: As directed    Call MD for:  persistant dizziness or light-headedness   Complete by: As directed    Call MD for:  persistant nausea and vomiting   Complete by: As directed    Call MD for:  redness, tenderness, or signs of infection (pain, swelling, redness, odor or green/yellow discharge around incision site)   Complete by: As directed    Call MD for:  severe uncontrolled pain   Complete by: As directed    Call MD for:  temperature >100.4   Complete by: As directed    Diet - low sodium heart healthy   Complete by: As directed    Increase activity slowly   Complete by: As directed      Allergies as of 12/02/2019   No Known Allergies     Medication List    TAKE these medications   cholecalciferol 25 MCG (1000 UT) tablet Commonly known as: VITAMIN D3 Take 2,000 Units by mouth daily.   Clear Eyes for Dry Eyes 1-0.25 % Soln Generic drug: Carboxymethylcellul-Glycerin Place 1 drop into both eyes at bedtime.   ferrous sulfate 324 (65 Fe) MG Tbec Take 648 mg by mouth daily. 2 tabs   Multi-Vitamins Tabs Take 1 tablet by mouth daily.   Oriahnn 300-1-0.5 & 300 MG Cppk Generic drug: Elagolix-Estrad-Noreth &  Elago Take 1 capsule by mouth 2 (two) times daily.   oxyCODONE-acetaminophen 5-325 MG tablet Commonly known as: PERCOCET/ROXICET Take 1-2 tablets by mouth every 4 (four) hours as needed for severe pain.   traMADol 50 MG tablet Commonly known as: Ultram Take 1-2 tablets (50-100 mg total) by mouth every 6 (six) hours as needed.        Signed: Ala Dach, MD 12/02/2019, 6:08 PM

## 2019-12-02 NOTE — Plan of Care (Signed)
  Problem: Education: Goal: Knowledge of General Education information will improve Description: Including pain rating scale, medication(s)/side effects and non-pharmacologic comfort measures Outcome: Completed/Met   Problem: Education: Goal: Knowledge of General Education information will improve Description: Including pain rating scale, medication(s)/side effects and non-pharmacologic comfort measures Outcome: Completed/Met   Problem: Health Behavior/Discharge Planning: Goal: Ability to manage health-related needs will improve Outcome: Completed/Met   Problem: Clinical Measurements: Goal: Ability to maintain clinical measurements within normal limits will improve Outcome: Completed/Met Goal: Will remain free from infection Outcome: Completed/Met Goal: Diagnostic test results will improve Outcome: Completed/Met Goal: Respiratory complications will improve Outcome: Completed/Met Goal: Cardiovascular complication will be avoided Outcome: Completed/Met   Problem: Activity: Goal: Risk for activity intolerance will decrease Outcome: Completed/Met   Problem: Nutrition: Goal: Adequate nutrition will be maintained Outcome: Completed/Met   Problem: Coping: Goal: Level of anxiety will decrease Outcome: Completed/Met   Problem: Elimination: Goal: Will not experience complications related to bowel motility Outcome: Completed/Met Goal: Will not experience complications related to urinary retention Outcome: Completed/Met   Problem: Pain Managment: Goal: General experience of comfort will improve Outcome: Completed/Met   Problem: Safety: Goal: Ability to remain free from injury will improve Outcome: Completed/Met   Problem: Skin Integrity: Goal: Risk for impaired skin integrity will decrease Outcome: Completed/Met   Problem: Education: Goal: Required Educational Video(s) Outcome: Completed/Met   Problem: Clinical Measurements: Goal: Ability to maintain clinical  measurements within normal limits will improve Outcome: Completed/Met Goal: Postoperative complications will be avoided or minimized Outcome: Completed/Met   Problem: Skin Integrity: Goal: Demonstration of wound healing without infection will improve Outcome: Completed/Met

## 2019-12-02 NOTE — Progress Notes (Signed)
Carmon Sails to be D/C'd  per MD order. Discussed with the patient and all questions fully answered.  VSS, Skin clean, dry and intact without evidence of skin break down, no evidence of skin tears noted.  IV catheter discontinued intact. Site without signs and symptoms of complications. Dressing and pressure applied.  An After Visit Summary was printed and given to the patient. Patient received prescription.  D/c education completed with patient/family including follow up instructions, medication list, d/c activities limitations if indicated, with other d/c instructions as indicated by MD - patient able to verbalize understanding, all questions fully answered.   Patient instructed to return to ED, call 911, or call MD for any changes in condition.   Patient to be escorted via [place], and D/C home via private auto.

## 2019-12-02 NOTE — Progress Notes (Signed)
1 Day Post-Op Procedure(s) (LRB): HYSTERECTOMY ABDOMINAL WITH SALPINGECTOMY (Bilateral)  Subjective: Patient reports nausea, incisional pain, tolerating PO and no problems voiding.   Tolerating PO well. Voided without difficulty. No vaginal bleeding reported. No CP or SOB. No BM yet. No flatus noted.  Would like to go home today.  Objective: I have reviewed patient's vital signs, intake and output, medications and labs. BP (!) 137/106 (BP Location: Right Arm)   Pulse 85   Temp 98.3 F (36.8 C) (Oral)   Resp 18   Ht 5\' 11"  (1.803 m)   Wt 116.6 kg   SpO2 100%   BMI 35.84 kg/m    CBC    Component Value Date/Time   WBC 8.6 12/02/2019 0409   RBC 4.02 12/02/2019 0409   HGB 10.5 (L) 12/02/2019 0409   HCT 34.0 (L) 12/02/2019 0409   PLT 271 12/02/2019 0409   MCV 84.6 12/02/2019 0409   MCH 26.1 12/02/2019 0409   MCHC 30.9 12/02/2019 0409   RDW 17.0 (H) 12/02/2019 0409      General: alert, cooperative and appears stated age Resp: clear to auscultation bilaterally Cardio: regular rate and rhythm, S1, S2 normal, no murmur, click, rub or gallop GI: soft, non-tender; bowel sounds normal; no masses,  no organomegaly and incision: clean, dry and intact Extremities: extremities normal, atraumatic, no cyanosis or edema Vaginal Bleeding: minimal  Assessment: s/p Procedure(s): HYSTERECTOMY ABDOMINAL WITH SALPINGECTOMY (Bilateral): stable, progressing well and tolerating diet Post op anemia - asymptomatic Elevated BP - fu discussed Plan: Advance diet Encourage ambulation Advance to PO medication Discontinue IV fluids Discharge home  LOS: 1 day    Bailey Bennett 12/02/2019, 12:31 PM

## 2019-12-04 LAB — SURGICAL PATHOLOGY

## 2019-12-06 DIAGNOSIS — R1084 Generalized abdominal pain: Secondary | ICD-10-CM | POA: Diagnosis not present

## 2019-12-06 DIAGNOSIS — R030 Elevated blood-pressure reading, without diagnosis of hypertension: Secondary | ICD-10-CM | POA: Diagnosis not present

## 2019-12-06 DIAGNOSIS — Z90710 Acquired absence of both cervix and uterus: Secondary | ICD-10-CM | POA: Diagnosis not present

## 2019-12-06 DIAGNOSIS — Z90721 Acquired absence of ovaries, unilateral: Secondary | ICD-10-CM | POA: Diagnosis not present

## 2019-12-13 DIAGNOSIS — I10 Essential (primary) hypertension: Secondary | ICD-10-CM | POA: Diagnosis not present

## 2020-03-22 DIAGNOSIS — L821 Other seborrheic keratosis: Secondary | ICD-10-CM | POA: Diagnosis not present

## 2020-03-22 DIAGNOSIS — L918 Other hypertrophic disorders of the skin: Secondary | ICD-10-CM | POA: Diagnosis not present

## 2020-04-10 DIAGNOSIS — M545 Low back pain: Secondary | ICD-10-CM | POA: Diagnosis not present

## 2020-04-10 DIAGNOSIS — M9903 Segmental and somatic dysfunction of lumbar region: Secondary | ICD-10-CM | POA: Diagnosis not present

## 2020-04-10 DIAGNOSIS — M5417 Radiculopathy, lumbosacral region: Secondary | ICD-10-CM | POA: Diagnosis not present

## 2020-04-10 DIAGNOSIS — M9902 Segmental and somatic dysfunction of thoracic region: Secondary | ICD-10-CM | POA: Diagnosis not present

## 2020-04-18 DIAGNOSIS — M9902 Segmental and somatic dysfunction of thoracic region: Secondary | ICD-10-CM | POA: Diagnosis not present

## 2020-04-18 DIAGNOSIS — M5417 Radiculopathy, lumbosacral region: Secondary | ICD-10-CM | POA: Diagnosis not present

## 2020-04-18 DIAGNOSIS — M545 Low back pain: Secondary | ICD-10-CM | POA: Diagnosis not present

## 2020-04-18 DIAGNOSIS — M9903 Segmental and somatic dysfunction of lumbar region: Secondary | ICD-10-CM | POA: Diagnosis not present

## 2020-06-04 DIAGNOSIS — E559 Vitamin D deficiency, unspecified: Secondary | ICD-10-CM | POA: Diagnosis not present

## 2020-06-04 DIAGNOSIS — Z7721 Contact with and (suspected) exposure to potentially hazardous body fluids: Secondary | ICD-10-CM | POA: Diagnosis not present

## 2020-06-04 DIAGNOSIS — A6000 Herpesviral infection of urogenital system, unspecified: Secondary | ICD-10-CM | POA: Diagnosis not present

## 2020-06-04 DIAGNOSIS — I10 Essential (primary) hypertension: Secondary | ICD-10-CM | POA: Diagnosis not present

## 2020-06-04 DIAGNOSIS — D649 Anemia, unspecified: Secondary | ICD-10-CM | POA: Diagnosis not present

## 2020-06-04 DIAGNOSIS — R7301 Impaired fasting glucose: Secondary | ICD-10-CM | POA: Diagnosis not present

## 2020-06-04 DIAGNOSIS — Z0000 Encounter for general adult medical examination without abnormal findings: Secondary | ICD-10-CM | POA: Diagnosis not present

## 2020-06-13 ENCOUNTER — Other Ambulatory Visit: Payer: Self-pay

## 2020-06-13 ENCOUNTER — Encounter (HOSPITAL_BASED_OUTPATIENT_CLINIC_OR_DEPARTMENT_OTHER): Payer: Self-pay

## 2020-06-13 ENCOUNTER — Emergency Department (HOSPITAL_BASED_OUTPATIENT_CLINIC_OR_DEPARTMENT_OTHER)
Admission: EM | Admit: 2020-06-13 | Discharge: 2020-06-13 | Disposition: A | Discharge status: Home / Self Care | Payer: BC Managed Care – PPO | Attending: Emergency Medicine | Admitting: Emergency Medicine

## 2020-06-13 ENCOUNTER — Emergency Department (HOSPITAL_BASED_OUTPATIENT_CLINIC_OR_DEPARTMENT_OTHER): Payer: BC Managed Care – PPO

## 2020-06-13 DIAGNOSIS — R42 Dizziness and giddiness: Secondary | ICD-10-CM | POA: Insufficient documentation

## 2020-06-13 DIAGNOSIS — R270 Ataxia, unspecified: Secondary | ICD-10-CM | POA: Diagnosis not present

## 2020-06-13 DIAGNOSIS — G9389 Other specified disorders of brain: Secondary | ICD-10-CM | POA: Diagnosis not present

## 2020-06-13 LAB — COMPREHENSIVE METABOLIC PANEL
ALT: 18 U/L (ref 0–44)
AST: 21 U/L (ref 15–41)
Albumin: 3.8 g/dL (ref 3.5–5.0)
Alkaline Phosphatase: 65 U/L (ref 38–126)
Anion gap: 10 (ref 5–15)
BUN: 17 mg/dL (ref 6–20)
CO2: 22 mmol/L (ref 22–32)
Calcium: 9.2 mg/dL (ref 8.9–10.3)
Chloride: 108 mmol/L (ref 98–111)
Creatinine, Ser: 0.73 mg/dL (ref 0.44–1.00)
GFR calc Af Amer: >60 mL/min (ref >60)
GFR calc non Af Amer: >60 mL/min (ref >60)
Glucose, Bld: 121 mg/dL — ABNORMAL HIGH (ref 70–99)
Potassium: 4.2 mmol/L (ref 3.5–5.1)
Sodium: 140 mmol/L (ref 135–145)
Total Bilirubin: 0.6 mg/dL (ref 0.3–1.2)
Total Protein: 7.4 g/dL (ref 6.5–8.1)

## 2020-06-13 LAB — CBC WITH DIFFERENTIAL/PLATELET
Abs Immature Granulocytes: 0.02 10*3/uL (ref 0.00–0.07)
Basophils Absolute: 0 10*3/uL (ref 0.0–0.1)
Basophils Relative: 0 %
Eosinophils Absolute: 0 10*3/uL (ref 0.0–0.5)
Eosinophils Relative: 0 %
HCT: 40.9 % (ref 36.0–46.0)
Hemoglobin: 13.7 g/dL (ref 12.0–15.0)
Immature Granulocytes: 0 %
Lymphocytes Relative: 27 %
Lymphs Abs: 1.7 10*3/uL (ref 0.7–4.0)
MCH: 30.8 pg (ref 26.0–34.0)
MCHC: 33.5 g/dL (ref 30.0–36.0)
MCV: 91.9 fL (ref 80.0–100.0)
Monocytes Absolute: 0.4 10*3/uL (ref 0.1–1.0)
Monocytes Relative: 6 %
Neutro Abs: 4.1 10*3/uL (ref 1.7–7.7)
Neutrophils Relative %: 67 %
Platelets: 257 10*3/uL (ref 150–400)
RBC: 4.45 MIL/uL (ref 3.87–5.11)
RDW: 13.5 % (ref 11.5–15.5)
WBC: 6.2 10*3/uL (ref 4.0–10.5)
nRBC: 0 % (ref 0.0–0.2)

## 2020-06-13 MED ORDER — SODIUM CHLORIDE 0.9 % IV BOLUS
1000.0000 mL | Freq: Once | INTRAVENOUS | Status: AC
  Administered 2020-06-13: 1000 mL via INTRAVENOUS

## 2020-06-13 MED ORDER — SODIUM CHLORIDE 0.9 % IV SOLN: INTRAVENOUS | Status: DC

## 2020-06-13 MED ORDER — MECLIZINE HCL 25 MG PO TABS: 25.0000 mg | ORAL_TABLET | Freq: Three times a day (TID) | ORAL | 0 refills | Status: AC | PRN

## 2020-06-13 MED ORDER — ONDANSETRON HCL 4 MG/2ML IJ SOLN
4.0000 mg | Freq: Once | INTRAMUSCULAR | Status: AC
  Administered 2020-06-13: 4 mg via INTRAVENOUS
  Filled 2020-06-13: qty 2

## 2020-06-13 MED ORDER — MECLIZINE HCL 25 MG PO TABS
25.0000 mg | ORAL_TABLET | Freq: Once | ORAL | Status: AC
  Administered 2020-06-13: 25 mg via ORAL
  Filled 2020-06-13: qty 1

## 2020-06-13 MED FILL — MECLIZINE 25 MG TABLET: 25 | 10 days supply | Qty: 30 | Fill #0

## 2020-06-13 NOTE — Discharge Instructions (Signed)
Take the Antivert as directed.  Return for any new or worse symptoms.  Make an appointment to follow-up with ear nose and throat and neurology.  Also recommend you follow-up with your primary care doctor.

## 2020-06-13 NOTE — ED Triage Notes (Signed)
Pt states awoke 2am with feeling like room spinning, improved slightly after lying down, sensation returns with changes in position.  Took metoprolol left over from previous prescription in case it was related to blood pressure.

## 2020-06-13 NOTE — ED Notes (Signed)
ED Provider at bedside. 

## 2020-06-13 NOTE — ED Notes (Signed)
Patient transported to CT 

## 2020-06-13 NOTE — ED Provider Notes (Signed)
[facility]PART[facility] Provider Note   CSN: [id number] Arrival date & time: 06/13/20  4008     History Chief Complaint  Patient presents with  . Dizziness    Bailey Bennett is a 44 y.o. female.  Patient went to bed at 9 PM last evening awoke around 2 in the morning to use the bathroom and discovered that she was having room spinning.  And feeling nauseated.  Is made worse with changes in positions and moving her head.  Patient has had history of this room spinning before most recently in December.  They thought maybe was related to her blood pressure.  Patient does have a beta-blocker to take but only takes it intermittently does not take it on a regular basis.  She is on a long-acting beta-blocker.  Takes it once a day.  Did take it early this morning.  Patient denies any hearing changes or any visual changes or any numbness or weakness to her legs.  Also none to her upper extremities.        Past Medical History:  Diagnosis Date  . Anemia    secondary to uterine fibroid  . Uterine fibroid     Patient Active Problem List   Diagnosis Date Noted  . Uterine fibroid 12/01/2019  . Fibroids 12/01/2019  . Obesity (BMI 30-39.9) 09/22/2016  . 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 Family history of colon cancer requiring screening colonoscopy 04/27/2014  . Abnormal glandular Papanicolaou smear of cervix 10/19/2011  . Anemia 10/19/2011  . Genital herpes 10/19/2011  . Goiter 10/19/2011  . Vitamin D deficiency 10/19/2011    Past Surgical History:  Procedure Laterality Date  . ABDOMINAL HYSTERECTOMY  12/01/2019  . HYSTERECTOMY ABDOMINAL WITH SALPINGECTOMY Bilateral 12/01/2019   Procedure: HYSTERECTOMY ABDOMINAL WITH SALPINGECTOMY;  Surgeon: Aloha Gell, MD;  Location: [facility];  Service: Gynecology;  Laterality: Bilateral;  . IR RADIOLOGIST EVAL & MGMT  11/17/2018  . TUBAL LIGATION       OB History   No obstetric history on file.     History  reviewed. No pertinent family history.  Social History   Tobacco Use  . Smoking status: Never Smoker  . Smokeless tobacco: Never Used  Vaping Use  . Vaping Use: Never used  Substance Use Topics  . Alcohol use: Not Currently  . Drug use: Never    Home Medications Prior to Admission medications   Medication Sig Start Date End Date Taking? Authorizing Provider  Carboxymethylcellul-Glycerin (CLEAR EYES FOR DRY EYES) 1-0.25 % SOLN Place 1 drop into both eyes at bedtime.    [provider]  cholecalciferol (VITAMIN D3) 25 MCG (1000 UT) tablet Take 2,000 Units by mouth daily.     [provider]  ferrous sulfate 324 (65 Fe) MG TBEC Take 648 mg by mouth daily. 2 tabs    [provider]  meclizine (ANTIVERT) 25 MG tablet Take 1 tablet (25 mg total) by mouth 3 (three) times daily as needed for dizziness. 06/13/20   Fredia Sorrow, MD  Multiple Vitamin (MULTI-VITAMINS) TABS Take 1 tablet by mouth daily.     [provider]  ORIAHNN 300-1-0.5 & 300 MG CPPK Take 1 capsule by mouth 2 (two) times daily. 11/01/19   [provider]  oxyCODONE-acetaminophen (PERCOCET/ROXICET) 5-325 MG tablet Take 1-2 tablets by mouth every 4 (four) hours as needed for severe pain. 12/02/19   Brien Few, MD  traMADol (ULTRAM) 50 MG tablet Take 1-2 tablets (50-100 mg total)  by mouth every 6 (six) hours as needed. 12/02/19   Brien Few, MD    Allergies    Patient has no known allergies.  Review of Systems   Review of Systems  Constitutional: Negative for chills and fever.  HENT: Negative for congestion, rhinorrhea and sore throat.   Eyes: Negative for visual disturbance.  Respiratory: Negative for cough and shortness of breath.   Cardiovascular: Negative for chest pain and leg swelling.  Gastrointestinal: Negative for abdominal pain, diarrhea, nausea and vomiting.  Genitourinary: Negative for dysuria.  Musculoskeletal: Negative for back pain and neck pain.    Skin: Negative for rash.  Neurological: Positive for dizziness. Negative for syncope, speech difficulty, weakness, light-headedness, numbness and headaches.  Hematological: Does not bruise/bleed easily.  Psychiatric/Behavioral: Negative for confusion.    Physical Exam Updated Vital Signs BP 128/90   Pulse 84 Comment: rm air  Temp 98.5 F (36.9 C) (Oral)   Resp 11 Comment: rm air  Ht 1.803 m (5\' 11" )   Wt 113.4 kg   LMP 10/24/2019 (Approximate)   SpO2 100% Comment: rm air  BMI 34.87 kg/m   Physical Exam Vitals and nursing note reviewed.  Constitutional:      General: She is not in acute distress.    Appearance: Normal appearance. She is well-developed.  HENT:     Head: Normocephalic and atraumatic.     Right Ear: Tympanic membrane normal.     Left Ear: Tympanic membrane normal.  Eyes:     Extraocular Movements: Extraocular movements intact.     Conjunctiva/sclera: Conjunctivae normal.     Pupils: Pupils are equal, round, and reactive to light.  Cardiovascular:     Rate and Rhythm: Normal rate and regular rhythm.     Heart sounds: No murmur heard.   Pulmonary:     Effort: Pulmonary effort is normal. No respiratory distress.     Breath sounds: Normal breath sounds.  Abdominal:     Palpations: Abdomen is soft.     Tenderness: There is no abdominal tenderness.  Musculoskeletal:        General: No swelling. Normal range of motion.     Cervical back: Normal range of motion and neck supple. No rigidity or tenderness.  Skin:    General: Skin is warm and dry.     Capillary Refill: Capillary refill takes less than 2 seconds.  Neurological:     General: No focal deficit present.     Mental Status: She is alert and oriented to person, place, and time.     Cranial Nerves: No cranial nerve deficit.     Sensory: No sensory deficit.     Motor: No weakness.     ED Results / Procedures / Treatments   Labs (all labs ordered are listed, but only abnormal results are  displayed) Labs Reviewed  COMPREHENSIVE METABOLIC PANEL - Abnormal; Notable for the following components:      Result Value   Glucose, Bld 121 (*)    All other components within normal limits  CBC WITH DIFFERENTIAL/PLATELET    EKG EKG Interpretation  Date/Time:  Thursday June 13 2020 08:53:14 EDT Ventricular Rate:  81 PR Interval:    QRS Duration: 88 QT Interval:  393 QTC Calculation: 457 R Axis:   13 Text Interpretation: no previous Sinus rhythm Confirmed by Fredia Sorrow [phone]) on 06/13/2020 8:59:22 AM   Radiology CT Head Wo Contrast  Result Date: 06/13/2020 CLINICAL DATA:  Ataxia, stroke suspected. Additional history provided: Patient awoke at  2 a.m. feeling "like the room was spinning," improved slightly after lying down, sensation returns with changes in position. EXAM: CT HEAD WITHOUT CONTRAST TECHNIQUE: Contiguous axial images were obtained from the base of the skull through the vertex without intravenous contrast. COMPARISON:  No pertinent prior studies available for comparison. FINDINGS: Brain: Cerebral volume is normal. There is no acute intracranial hemorrhage. No demarcated cortical infarct. No extra-axial fluid collection. No evidence of intracranial mass. No midline shift. Prominent right frontal dural calcification Vascular: No hyperdense vessel. Skull: Normal. Negative for fracture or focal lesion. Sinuses/Orbits: Visualized orbits show no acute finding. No significant paranasal sinus disease or mastoid effusion at the imaged levels. IMPRESSION: Unremarkable noncontrast CT appearance of the brain. No evidence of acute intracranial abnormality. Electronically Signed   By: Kellie Simmering DO   On: 06/13/2020 09:56    Procedures Procedures (including critical care time)  Medications Ordered in ED Medications  0.9 %  sodium chloride infusion ( Intravenous Not Given 06/13/20 1104)  sodium chloride 0.9 % bolus 1,000 mL (0 mLs Intravenous Stopped 06/13/20 1039)  meclizine  (ANTIVERT) tablet 25 mg (25 mg Oral Given 06/13/20 0859)  ondansetron (ZOFRAN) injection 4 mg (4 mg Intravenous Given 06/13/20 0859)    ED Course  I have reviewed the triage vital signs and the nursing notes.  Pertinent labs & imaging results that were available during my care of the patient were reviewed by me and considered in my medical decision making (see chart for details).    MDM Rules/Calculators/A&P                          Patient symptoms seem to be consistent with vertigo.  The you can elicit them with head movement.  CT head without acute findings.  Patient treated with Antivert here with improvement.  Also patient's blood pressure improved while she was here as well.  But she had taken her long-acting beta-blocker.  Feel that symptoms unlikely due to stroke.  But I am recommending follow-up with ear nose and throat as well as neurology.  And they also contacting her primary care doctor for scheduling an outpatient MRI.  Patient will return for any new or worse symptoms.  Patient will continue the Antivert at home.  Final Clinical Impression(s) / ED Diagnoses Final diagnoses:  Vertigo    Rx / DC Orders ED Discharge Orders         Ordered    meclizine (ANTIVERT) 25 MG tablet  3 times daily PRN     Discontinue  Reprint     06/13/20 1053           Fredia Sorrow, MD 06/13/20 1505

## 2020-06-18 DIAGNOSIS — R42 Dizziness and giddiness: Secondary | ICD-10-CM | POA: Diagnosis not present

## 2020-06-18 DIAGNOSIS — H6122 Impacted cerumen, left ear: Secondary | ICD-10-CM | POA: Diagnosis not present

## 2020-06-21 DIAGNOSIS — Z09 Encounter for follow-up examination after completed treatment for conditions other than malignant neoplasm: Secondary | ICD-10-CM | POA: Diagnosis not present

## 2020-06-21 DIAGNOSIS — R519 Headache, unspecified: Secondary | ICD-10-CM | POA: Diagnosis not present

## 2020-06-21 DIAGNOSIS — R42 Dizziness and giddiness: Secondary | ICD-10-CM | POA: Diagnosis not present

## 2020-06-21 DIAGNOSIS — I10 Essential (primary) hypertension: Secondary | ICD-10-CM | POA: Diagnosis not present

## 2020-07-04 DIAGNOSIS — Z1231 Encounter for screening mammogram for malignant neoplasm of breast: Secondary | ICD-10-CM | POA: Diagnosis not present

## 2021-07-03 IMAGING — CT CT HEAD W/O CM
3 series · 16 of 47 positions shown, 19 images · non-contrast
Comparison: No pertinent prior studies available for comparison.

CLINICAL DATA: Ataxia, stroke suspected. Additional history
provided: Patient awoke at 2 a.m. feeling "like the room was
spinning," improved slightly after lying down, sensation returns
with changes in position.

EXAM:
CT HEAD WITHOUT CONTRAST
TECHNIQUE: Contiguous axial images were obtained from the base of the skull
through the vertex without intravenous contrast.

[Series 2: head wo · axial · 0.43mm/px · z∈[+962,+1097]mm · 10 of 33 slices shown, 13 images]
[im 3/33  brain]
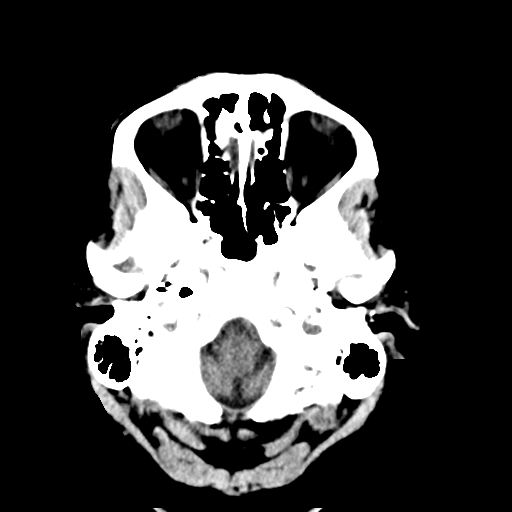
[im 3/33  bone]
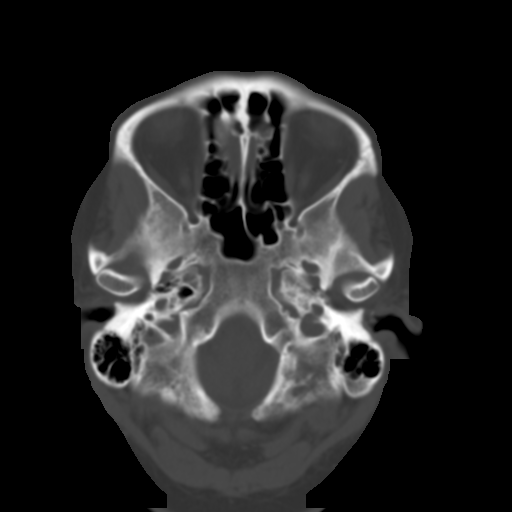
[im 6/33  brain]
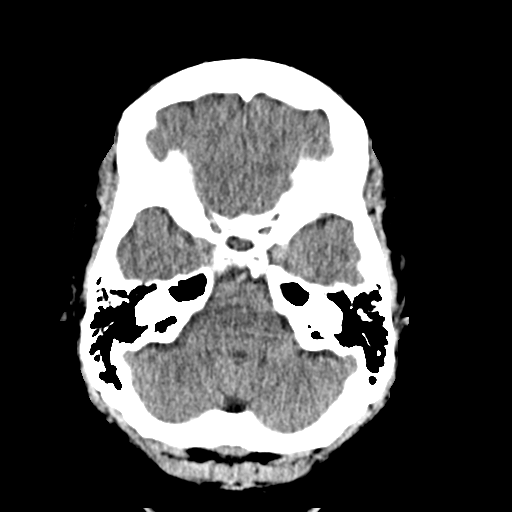
[im 9/33  brain]
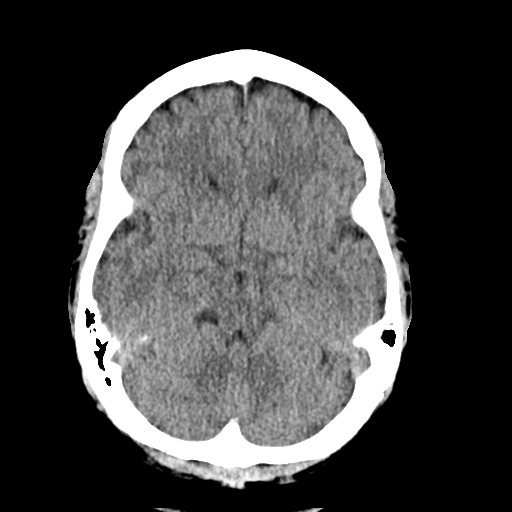
[im 12/33  brain]
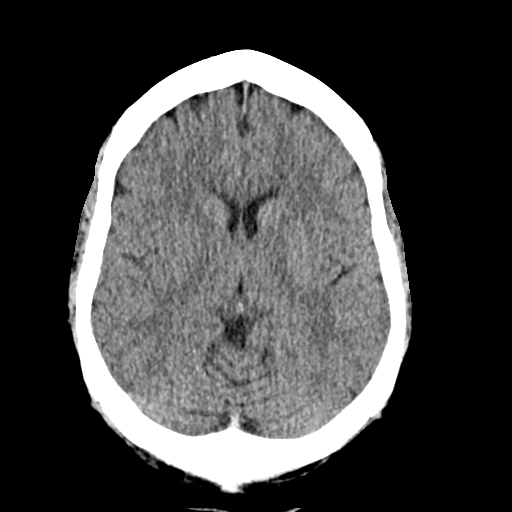
[im 15/33  brain]
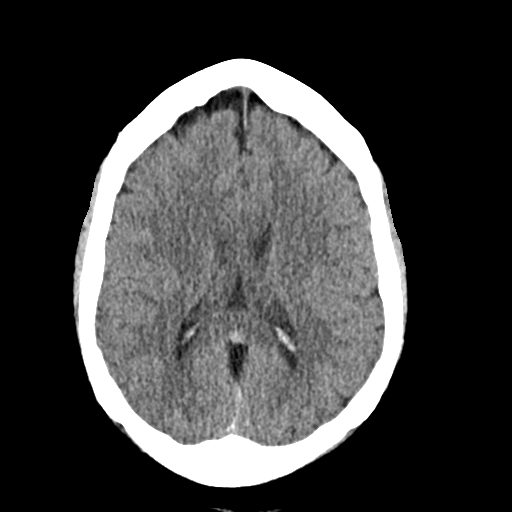
[im 15/33  bone]
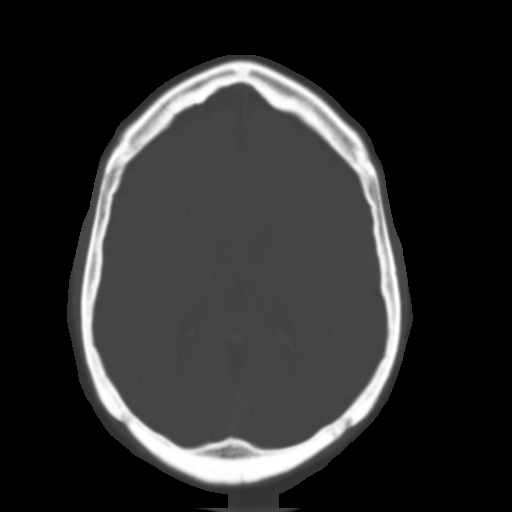
[im 18/33  brain]
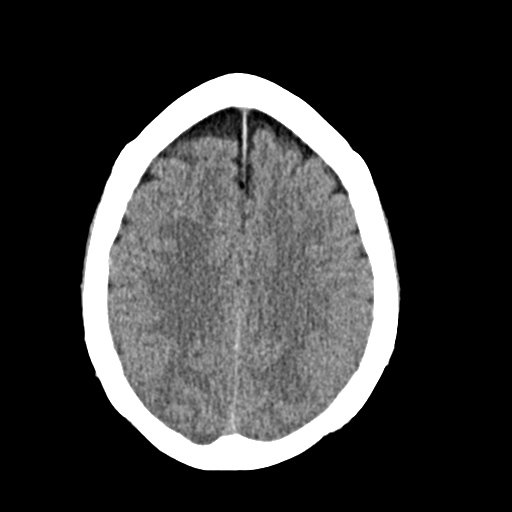
[im 21/33  brain]
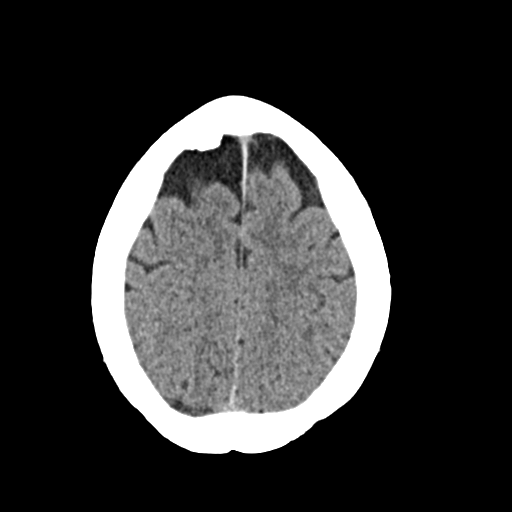
[im 25/33  brain]
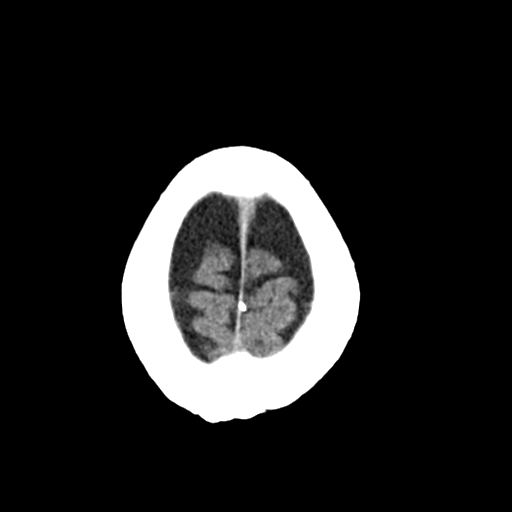
[im 27/33  brain]
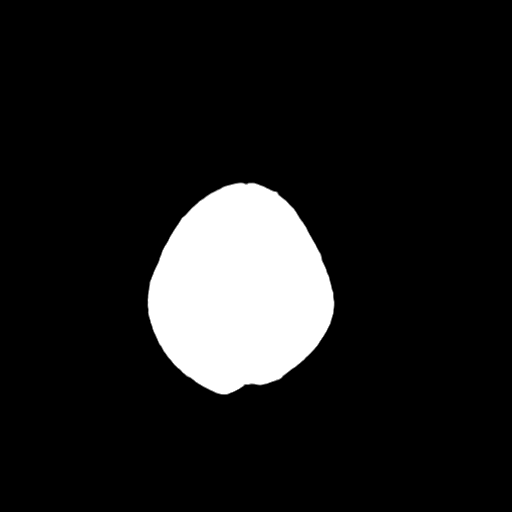
[im 27/33  bone]
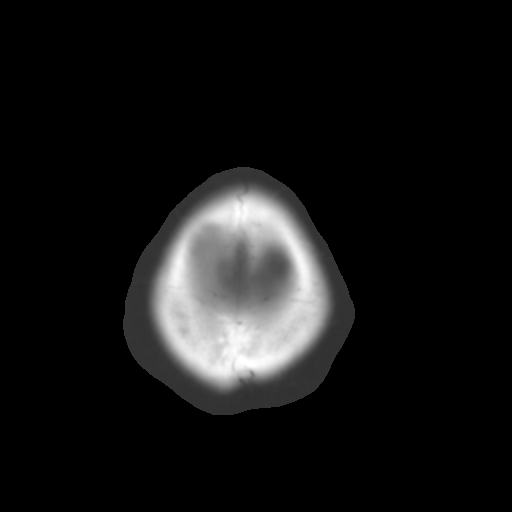
[im 30/33  brain]
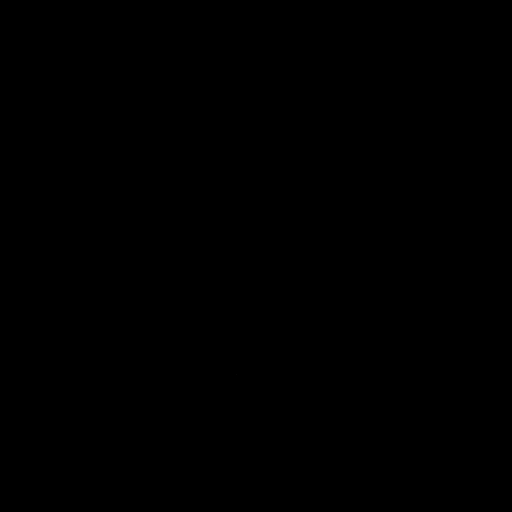

[Series 4: coronal soft · coronal · 0.34mm/px · 3 of 69 slices shown]
[im 23/69  brain]
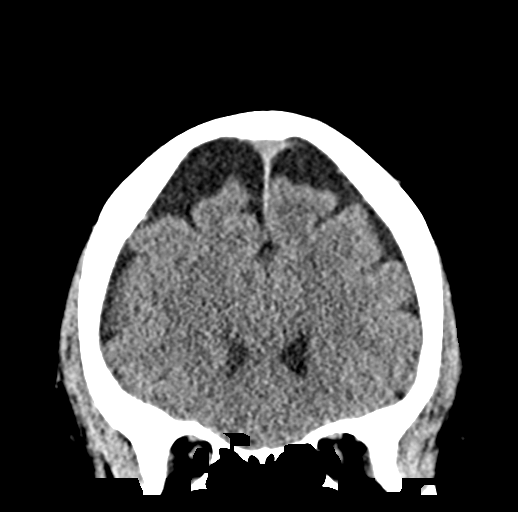
[im 31/69  brain]
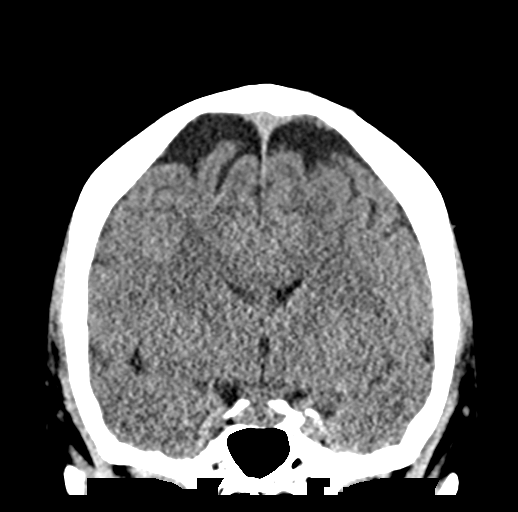
[im 38/69  brain]
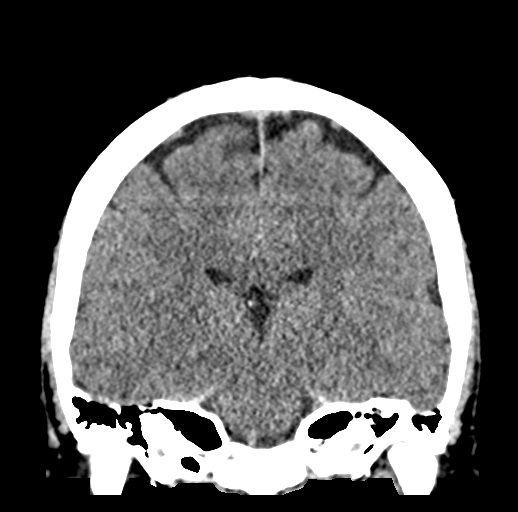

[Series 5: sag soft · sagittal · 0.33mm/px · 3 of 56 slices shown]
[im 19/56  brain]
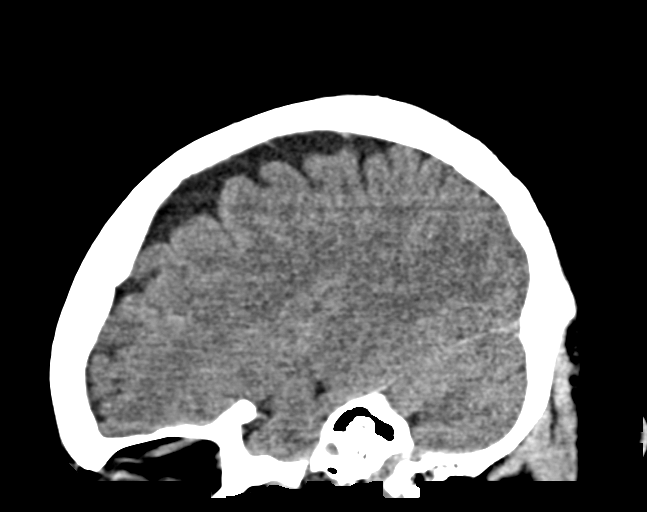
[im 28/56  brain]
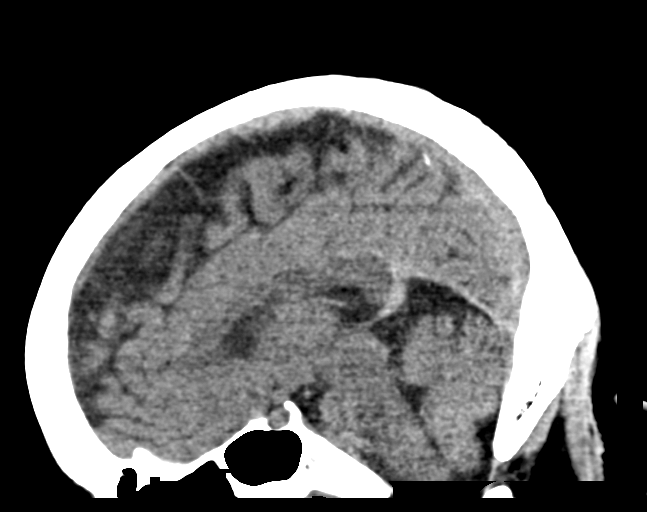
[im 37/56  brain]
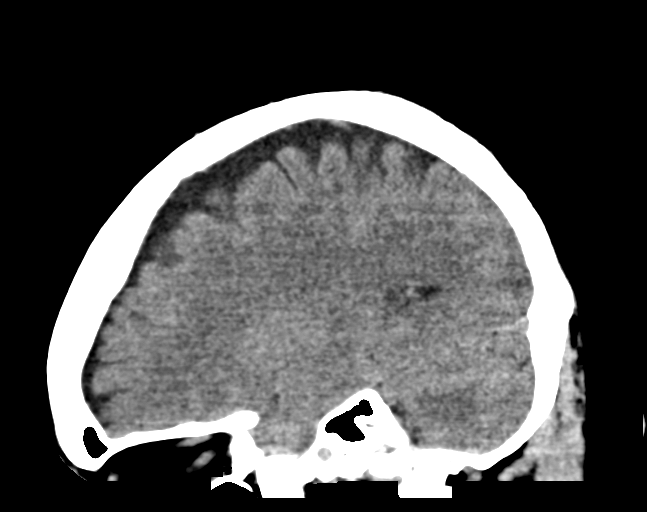

[16 of 47 positions shown; findings below may reference images not displayed]

FINDINGS: Brain:

Cerebral volume is normal.

There is no acute intracranial hemorrhage.

No demarcated cortical infarct.

No extra-axial fluid collection.

No evidence of intracranial mass.

No midline shift.

Prominent right frontal dural calcification

Vascular: No hyperdense vessel.

Skull: Normal. Negative for fracture or focal lesion.

Sinuses/Orbits: Visualized orbits show no acute finding. No
significant paranasal sinus disease or mastoid effusion at the
imaged levels.
IMPRESSION: Unremarkable noncontrast CT appearance of the brain. No evidence of
acute intracranial abnormality.
